# Patient Record
Sex: Male | Born: 1970 | Race: Asian | Hispanic: No | Marital: Single
Health system: Southern US, Community
[De-identification: ages and names within clinical notes are randomized; demographics above are authoritative.]

## PROBLEM LIST (undated history)

## (undated) DIAGNOSIS — I469 Cardiac arrest, cause unspecified: Secondary | ICD-10-CM

## (undated) DIAGNOSIS — E119 Type 2 diabetes mellitus without complications: Secondary | ICD-10-CM

## (undated) DIAGNOSIS — N289 Disorder of kidney and ureter, unspecified: Secondary | ICD-10-CM

## (undated) DIAGNOSIS — I10 Essential (primary) hypertension: Secondary | ICD-10-CM

---

## 2015-11-19 ENCOUNTER — Inpatient Hospital Stay: Payer: Non-veteran care

## 2015-11-19 ENCOUNTER — Inpatient Hospital Stay: Admit: 2015-11-19 | Payer: Non-veteran care

## 2015-11-19 ENCOUNTER — Emergency Department: Payer: Non-veteran care

## 2015-11-19 ENCOUNTER — Inpatient Hospital Stay
Admission: EM | Admit: 2015-11-19 | Discharge: 2015-11-19 | DRG: 296 | Disposition: A | Discharge status: Other Acute Inpatient Hospital | Payer: Non-veteran care | Attending: Internal Medicine | Admitting: Internal Medicine

## 2015-11-19 ENCOUNTER — Encounter: Payer: Self-pay | Admitting: Emergency Medicine

## 2015-11-19 DIAGNOSIS — R001 Bradycardia, unspecified: Secondary | ICD-10-CM | POA: Diagnosis present

## 2015-11-19 DIAGNOSIS — Z992 Dependence on renal dialysis: Secondary | ICD-10-CM | POA: Diagnosis not present

## 2015-11-19 DIAGNOSIS — J810 Acute pulmonary edema: Secondary | ICD-10-CM | POA: Diagnosis present

## 2015-11-19 DIAGNOSIS — E875 Hyperkalemia: Secondary | ICD-10-CM | POA: Diagnosis present

## 2015-11-19 DIAGNOSIS — E872 Acidosis: Secondary | ICD-10-CM | POA: Diagnosis present

## 2015-11-19 DIAGNOSIS — I469 Cardiac arrest, cause unspecified: Secondary | ICD-10-CM | POA: Diagnosis present

## 2015-11-19 DIAGNOSIS — I959 Hypotension, unspecified: Secondary | ICD-10-CM | POA: Diagnosis present

## 2015-11-19 DIAGNOSIS — I120 Hypertensive chronic kidney disease with stage 5 chronic kidney disease or end stage renal disease: Secondary | ICD-10-CM | POA: Diagnosis present

## 2015-11-19 DIAGNOSIS — R680 Hypothermia, not associated with low environmental temperature: Secondary | ICD-10-CM | POA: Diagnosis present

## 2015-11-19 DIAGNOSIS — Z794 Long term (current) use of insulin: Secondary | ICD-10-CM | POA: Diagnosis not present

## 2015-11-19 DIAGNOSIS — Z9114 Patient's other noncompliance with medication regimen: Secondary | ICD-10-CM

## 2015-11-19 DIAGNOSIS — I442 Atrioventricular block, complete: Secondary | ICD-10-CM | POA: Diagnosis present

## 2015-11-19 DIAGNOSIS — I468 Cardiac arrest due to other underlying condition: Principal | ICD-10-CM | POA: Diagnosis present

## 2015-11-19 DIAGNOSIS — Z7901 Long term (current) use of anticoagulants: Secondary | ICD-10-CM | POA: Diagnosis not present

## 2015-11-19 DIAGNOSIS — E1122 Type 2 diabetes mellitus with diabetic chronic kidney disease: Secondary | ICD-10-CM | POA: Diagnosis present

## 2015-11-19 DIAGNOSIS — J9600 Acute respiratory failure, unspecified whether with hypoxia or hypercapnia: Secondary | ICD-10-CM | POA: Diagnosis present

## 2015-11-19 DIAGNOSIS — G253 Myoclonus: Secondary | ICD-10-CM | POA: Insufficient documentation

## 2015-11-19 DIAGNOSIS — I517 Cardiomegaly: Secondary | ICD-10-CM | POA: Diagnosis present

## 2015-11-19 DIAGNOSIS — G9341 Metabolic encephalopathy: Secondary | ICD-10-CM | POA: Diagnosis present

## 2015-11-19 DIAGNOSIS — D65 Disseminated intravascular coagulation [defibrination syndrome]: Secondary | ICD-10-CM | POA: Diagnosis present

## 2015-11-19 DIAGNOSIS — T82868A Thrombosis of vascular prosthetic devices, implants and grafts, initial encounter: Secondary | ICD-10-CM | POA: Diagnosis not present

## 2015-11-19 DIAGNOSIS — R4189 Other symptoms and signs involving cognitive functions and awareness: Secondary | ICD-10-CM

## 2015-11-19 DIAGNOSIS — N186 End stage renal disease: Secondary | ICD-10-CM | POA: Diagnosis present

## 2015-11-19 DIAGNOSIS — R0600 Dyspnea, unspecified: Secondary | ICD-10-CM

## 2015-11-19 DIAGNOSIS — G40901 Epilepsy, unspecified, not intractable, with status epilepticus: Secondary | ICD-10-CM | POA: Diagnosis present

## 2015-11-19 DIAGNOSIS — G931 Anoxic brain damage, not elsewhere classified: Secondary | ICD-10-CM | POA: Diagnosis present

## 2015-11-19 HISTORY — DX: Disorder of kidney and ureter, unspecified: N28.9

## 2015-11-19 HISTORY — DX: Essential (primary) hypertension: I10

## 2015-11-19 HISTORY — DX: Type 2 diabetes mellitus without complications: E11.9

## 2015-11-19 LAB — BLOOD GAS, ARTERIAL
Acid-base deficit: 13.2 mmol/L — ABNORMAL HIGH (ref 0.0–2.0)
Allens test (pass/fail): POSITIVE — AB
BICARBONATE: 13.6 meq/L — AB (ref 21.0–28.0)
FIO2: 1
MECHANICAL RATE: 18
MECHVT: 550 mL
O2 Saturation: 99.7 %
PEEP: 5 cmH2O
PO2 ART: 225 mmHg — AB (ref 83.0–108.0)
Patient temperature: 37
RATE: 18 resp/min
pCO2 arterial: 34 mmHg (ref 32.0–48.0)
pH, Arterial: 7.21 — ABNORMAL LOW (ref 7.350–7.450)

## 2015-11-19 LAB — BASIC METABOLIC PANEL
ANION GAP: 23 — AB (ref 5–15)
ANION GAP: 18 — AB (ref 5–15)
BUN: 113 mg/dL — ABNORMAL HIGH (ref 6–20)
BUN: 69 mg/dL — ABNORMAL HIGH (ref 6–20)
CALCIUM: 7.4 mg/dL — AB (ref 8.9–10.3)
CHLORIDE: 92 mmol/L — AB (ref 101–111)
CHLORIDE: 92 mmol/L — AB (ref 101–111)
CO2: 13 mmol/L — AB (ref 22–32)
CO2: 21 mmol/L — AB (ref 22–32)
Calcium: 9 mg/dL (ref 8.9–10.3)
Creatinine, Ser: 15.52 mg/dL — ABNORMAL HIGH (ref 0.61–1.24)
Creatinine, Ser: 9.76 mg/dL — ABNORMAL HIGH (ref 0.61–1.24)
GFR calc non Af Amer: 3 mL/min — ABNORMAL LOW (ref >60)
GFR calc non Af Amer: 6 mL/min — ABNORMAL LOW (ref >60)
GFR, EST AFRICAN AMERICAN: 4 mL/min — AB (ref >60)
GFR, EST AFRICAN AMERICAN: 7 mL/min — AB (ref >60)
Glucose, Bld: 188 mg/dL — ABNORMAL HIGH (ref 65–99)
Glucose, Bld: 180 mg/dL — ABNORMAL HIGH (ref 65–99)
POTASSIUM: 6.3 mmol/L — AB (ref 3.5–5.1)
SODIUM: 128 mmol/L — AB (ref 135–145)
Sodium: 131 mmol/L — ABNORMAL LOW (ref 135–145)

## 2015-11-19 LAB — PROTIME-INR
INR: 9.74 — AB
INR: 1.69
INR: 1.19
PROTHROMBIN TIME: 74.2 s — AB (ref 11.4–15.0)
Prothrombin Time: 19.9 seconds — ABNORMAL HIGH (ref 11.4–15.0)
Prothrombin Time: 15.3 seconds — ABNORMAL HIGH (ref 11.4–15.0)

## 2015-11-19 LAB — COMPREHENSIVE METABOLIC PANEL
ALBUMIN: 3 g/dL — AB (ref 3.5–5.0)
ALT: 232 U/L — ABNORMAL HIGH (ref 17–63)
ANION GAP: 17 — AB (ref 5–15)
AST: 381 U/L — ABNORMAL HIGH (ref 15–41)
Alkaline Phosphatase: 278 U/L — ABNORMAL HIGH (ref 38–126)
BUN: 55 mg/dL — ABNORMAL HIGH (ref 6–20)
CHLORIDE: 96 mmol/L — AB (ref 101–111)
CO2: 21 mmol/L — AB (ref 22–32)
Calcium: 8 mg/dL — ABNORMAL LOW (ref 8.9–10.3)
Creatinine, Ser: 7.19 mg/dL — ABNORMAL HIGH (ref 0.61–1.24)
GFR calc non Af Amer: 8 mL/min — ABNORMAL LOW (ref >60)
GFR, EST AFRICAN AMERICAN: 9 mL/min — AB (ref >60)
GLUCOSE: 115 mg/dL — AB (ref 65–99)
POTASSIUM: 4.1 mmol/L (ref 3.5–5.1)
SODIUM: 134 mmol/L — AB (ref 135–145)
Total Bilirubin: 1 mg/dL (ref 0.3–1.2)
Total Protein: 6 g/dL — ABNORMAL LOW (ref 6.5–8.1)

## 2015-11-19 LAB — PHOSPHORUS
PHOSPHORUS: 6.6 mg/dL — AB (ref 2.5–4.6)
PHOSPHORUS: 8.4 mg/dL — AB (ref 2.5–4.6)

## 2015-11-19 LAB — TYPE AND SCREEN
ABO/RH(D): AB POS
Antibody Screen: NEGATIVE

## 2015-11-19 LAB — CBC
HCT: 38.7 % — ABNORMAL LOW (ref 40.0–52.0)
HCT: 33.9 % — ABNORMAL LOW (ref 40.0–52.0)
HEMATOCRIT: 22 % — AB (ref 40.0–52.0)
HEMOGLOBIN: 12.1 g/dL — AB (ref 13.0–18.0)
HEMOGLOBIN: 11.3 g/dL — AB (ref 13.0–18.0)
HEMOGLOBIN: 7.2 g/dL — AB (ref 13.0–18.0)
MCH: 26.6 pg (ref 26.0–34.0)
MCH: 26.8 pg (ref 26.0–34.0)
MCH: 26.2 pg (ref 26.0–34.0)
MCHC: 31.2 g/dL — ABNORMAL LOW (ref 32.0–36.0)
MCHC: 33.4 g/dL (ref 32.0–36.0)
MCHC: 32.5 g/dL (ref 32.0–36.0)
MCV: 85.3 fL (ref 80.0–100.0)
MCV: 80.3 fL (ref 80.0–100.0)
MCV: 80.5 fL (ref 80.0–100.0)
PLATELETS: 232 10*3/uL (ref 150–440)
Platelets: 257 10*3/uL (ref 150–440)
Platelets: 173 10*3/uL (ref 150–440)
RBC: 4.53 MIL/uL (ref 4.40–5.90)
RBC: 4.22 MIL/uL — AB (ref 4.40–5.90)
RBC: 2.74 MIL/uL — ABNORMAL LOW (ref 4.40–5.90)
RDW: 16 % — ABNORMAL HIGH (ref 11.5–14.5)
RDW: 15.1 % — ABNORMAL HIGH (ref 11.5–14.5)
RDW: 14.9 % — ABNORMAL HIGH (ref 11.5–14.5)
WBC: 18.4 10*3/uL — AB (ref 3.8–10.6)
WBC: 17 10*3/uL — AB (ref 3.8–10.6)
WBC: 13.5 10*3/uL — AB (ref 3.8–10.6)

## 2015-11-19 LAB — FIBRINOGEN: Fibrinogen: 603 mg/dL — ABNORMAL HIGH (ref 210–470)

## 2015-11-19 LAB — HEMOGLOBIN A1C: Hgb A1c MFr Bld: 9 % — ABNORMAL HIGH (ref 4.0–6.0)

## 2015-11-19 LAB — GLUCOSE, CAPILLARY
GLUCOSE-CAPILLARY: 99 mg/dL (ref 65–99)
Glucose-Capillary: 125 mg/dL — ABNORMAL HIGH (ref 65–99)
Glucose-Capillary: 117 mg/dL — ABNORMAL HIGH (ref 65–99)
Glucose-Capillary: 153 mg/dL — ABNORMAL HIGH (ref 65–99)
Glucose-Capillary: 156 mg/dL — ABNORMAL HIGH (ref 65–99)

## 2015-11-19 LAB — TROPONIN I: Troponin I: 0.17 ng/mL — ABNORMAL HIGH (ref <0.031)

## 2015-11-19 LAB — FIBRIN DEGRADATION PROD.(ARMC ONLY)

## 2015-11-19 LAB — LACTIC ACID, PLASMA: Lactic Acid, Venous: 1.8 mmol/L (ref 0.5–2.0)

## 2015-11-19 LAB — TRIGLYCERIDES: TRIGLYCERIDES: 166 mg/dL — AB (ref <150)

## 2015-11-19 LAB — TSH: TSH: 2.593 u[IU]/mL (ref 0.350–4.500)

## 2015-11-19 LAB — ABO/RH: ABO/RH(D): AB POS

## 2015-11-19 LAB — MRSA PCR SCREENING: MRSA by PCR: NEGATIVE

## 2015-11-19 MED ORDER — PANTOPRAZOLE SODIUM 40 MG IV SOLR
40.0000 mg | Freq: Two times a day (BID) | INTRAVENOUS | Status: DC
  Administered 2015-11-19: 40 mg via INTRAVENOUS
  Filled 2015-11-19: qty 40

## 2015-11-19 MED ORDER — PANTOPRAZOLE SODIUM 40 MG IV SOLR: 40.0000 mg | Freq: Two times a day (BID) | INTRAVENOUS | Status: AC

## 2015-11-19 MED ORDER — DESMOPRESSIN ACETATE 4 MCG/ML IJ SOLN
20.0000 ug | Freq: Once | INTRAMUSCULAR | Status: DC
  Filled 2015-11-19: qty 5

## 2015-11-19 MED ORDER — PROPOFOL 1000 MG/100ML IV EMUL
5.0000 ug/kg/min | Freq: Once | INTRAVENOUS | Status: AC
Start: 2015-11-19 — End: 2015-11-19
  Administered 2015-11-19: 5 ug/kg/min via INTRAVENOUS

## 2015-11-19 MED ORDER — DEXTROSE 5 % IV SOLN
10.0000 mg | INTRAVENOUS | Status: AC
  Administered 2015-11-19: 10 mg via INTRAVENOUS
  Filled 2015-11-19: qty 1

## 2015-11-19 MED ORDER — SODIUM CHLORIDE 0.9% FLUSH
3.0000 mL | Freq: Two times a day (BID) | INTRAVENOUS | Status: DC
  Administered 2015-11-19: 3 mL via INTRAVENOUS

## 2015-11-19 MED ORDER — SODIUM CHLORIDE 0.9 % IV SOLN
500.0000 mg | INTRAVENOUS | Status: DC
  Filled 2015-11-19: qty 5

## 2015-11-19 MED ORDER — ROCURONIUM BROMIDE 50 MG/5ML IV SOLN
0.6000 mg/kg | Freq: Once | INTRAVENOUS | Status: AC
  Administered 2015-11-19: 49.2 mg via INTRAVENOUS
  Filled 2015-11-19: qty 1

## 2015-11-19 MED ORDER — HEPARIN SODIUM (PORCINE) 1000 UNIT/ML DIALYSIS
1000.0000 [IU] | INTRAMUSCULAR | Status: DC | PRN
  Filled 2015-11-19: qty 1

## 2015-11-19 MED ORDER — DOPAMINE-DEXTROSE 3.2-5 MG/ML-% IV SOLN: 0.0000 ug/kg/min | INTRAVENOUS | Status: AC

## 2015-11-19 MED ORDER — INSULIN ASPART 100 UNIT/ML ~~LOC~~ SOLN
8.0000 [IU] | Freq: Once | SUBCUTANEOUS | Status: AC
  Administered 2015-11-19: 8 [IU] via INTRAVENOUS

## 2015-11-19 MED ORDER — ACETAMINOPHEN 325 MG PO TABS: 650.0000 mg | ORAL_TABLET | Freq: Four times a day (QID) | ORAL | Status: DC | PRN

## 2015-11-19 MED ORDER — MIDAZOLAM HCL 2 MG/2ML IJ SOLN
4.0000 mg | Freq: Once | INTRAMUSCULAR | Status: AC
  Administered 2015-11-19: 4 mg via INTRAVENOUS

## 2015-11-19 MED ORDER — ALTEPLASE 2 MG IJ SOLR: 2.0000 mg | Freq: Once | INTRAMUSCULAR | Status: DC | PRN

## 2015-11-19 MED ORDER — PROPOFOL 1000 MG/100ML IV EMUL
5.0000 ug/kg/min | INTRAVENOUS | Status: DC
Start: 2015-11-19 — End: 2015-11-20
  Administered 2015-11-19: 5 ug/kg/min via INTRAVENOUS
  Filled 2015-11-19: qty 100

## 2015-11-19 MED ORDER — SODIUM CHLORIDE 0.9 % IV SOLN: 500.0000 mg | INTRAVENOUS | Status: AC

## 2015-11-19 MED ORDER — PROPOFOL 1000 MG/100ML IV EMUL
INTRAVENOUS | Status: AC
  Administered 2015-11-19: 5 ug/kg/min via INTRAVENOUS
  Filled 2015-11-19: qty 100

## 2015-11-19 MED ORDER — CHLORHEXIDINE GLUCONATE 0.12% ORAL RINSE (MEDLINE KIT)
15.0000 mL | Freq: Two times a day (BID) | OROMUCOSAL | Status: DC
  Administered 2015-11-19: 15 mL via OROMUCOSAL
  Filled 2015-11-19 (×3): qty 15

## 2015-11-19 MED ORDER — VALPROATE SODIUM 500 MG/5ML IV SOLN
500.0000 mg | Freq: Once | INTRAVENOUS | Status: AC
  Administered 2015-11-19: 500 mg via INTRAVENOUS
  Filled 2015-11-19: qty 5

## 2015-11-19 MED ORDER — ACETAMINOPHEN 10 MG/ML IV SOLN
1000.0000 mg | Freq: Once | INTRAVENOUS | Status: AC
  Administered 2015-11-19: 1000 mg via INTRAVENOUS
  Filled 2015-11-19: qty 100

## 2015-11-19 MED ORDER — CHLORHEXIDINE GLUCONATE 0.12% ORAL RINSE (MEDLINE KIT)
15.0000 mL | Freq: Two times a day (BID) | OROMUCOSAL | Status: DC
  Filled 2015-11-19 (×3): qty 15

## 2015-11-19 MED ORDER — ONDANSETRON HCL 4 MG/2ML IJ SOLN: 4.0000 mg | Freq: Four times a day (QID) | INTRAMUSCULAR | Status: DC | PRN

## 2015-11-19 MED ORDER — SODIUM CHLORIDE 0.9 % IV SOLN
20.0000 ug | Freq: Once | INTRAVENOUS | Status: AC
  Administered 2015-11-19: 20 ug via INTRAVENOUS
  Filled 2015-11-19: qty 5

## 2015-11-19 MED ORDER — MIDAZOLAM HCL 2 MG/2ML IJ SOLN
INTRAMUSCULAR | Status: AC
  Administered 2015-11-19: 4 mg via INTRAVENOUS
  Filled 2015-11-19: qty 4

## 2015-11-19 MED ORDER — SODIUM CHLORIDE 0.9 % IV SOLN: Freq: Once | INTRAVENOUS | Status: DC

## 2015-11-19 MED ORDER — ALBUTEROL SULFATE (2.5 MG/3ML) 0.083% IN NEBU
INHALATION_SOLUTION | RESPIRATORY_TRACT | Status: AC
  Administered 2015-11-19: 04:00:00
  Filled 2015-11-19: qty 3

## 2015-11-19 MED ORDER — PANTOPRAZOLE SODIUM 40 MG IV SOLR
40.0000 mg | INTRAVENOUS | Status: DC
  Administered 2015-11-19: 40 mg via INTRAVENOUS
  Filled 2015-11-19: qty 40

## 2015-11-19 MED ORDER — PROPOFOL 1000 MG/100ML IV EMUL: 5.0000 ug/kg/min | INTRAVENOUS | Status: AC

## 2015-11-19 MED ORDER — LORAZEPAM 2 MG/ML IJ SOLN: 2.0000 mg | INTRAMUSCULAR | Status: AC | PRN

## 2015-11-19 MED ORDER — VALPROATE SODIUM 500 MG/5ML IV SOLN
1500.0000 mg | Freq: Once | INTRAVENOUS | Status: AC
  Administered 2015-11-19: 1500 mg via INTRAVENOUS
  Filled 2015-11-19: qty 15

## 2015-11-19 MED ORDER — PENTAFLUOROPROP-TETRAFLUOROETH EX AERO: 1.0000 "application " | INHALATION_SPRAY | CUTANEOUS | Status: DC | PRN

## 2015-11-19 MED ORDER — ANTISEPTIC ORAL RINSE SOLUTION (CORINZ)
7.0000 mL | Freq: Four times a day (QID) | OROMUCOSAL | Status: DC
  Filled 2015-11-19 (×4): qty 7

## 2015-11-19 MED ORDER — PIPERACILLIN-TAZOBACTAM 3.375 G IVPB: 3.3750 g | Freq: Two times a day (BID) | INTRAVENOUS | Status: AC

## 2015-11-19 MED ORDER — FENTANYL CITRATE (PF) 100 MCG/2ML IJ SOLN: 100.0000 ug | INTRAMUSCULAR | Status: DC | PRN

## 2015-11-19 MED ORDER — PROTHROMBIN COMPLEX CONC HUMAN 500 UNITS IV KIT
2000.0000 [IU] | PACK | Status: AC
  Administered 2015-11-19: 2000 [IU] via INTRAVENOUS
  Filled 2015-11-19: qty 80

## 2015-11-19 MED ORDER — ETOMIDATE 2 MG/ML IV SOLN
INTRAVENOUS | Status: AC | PRN
  Administered 2015-11-19: 20 mg via INTRAVENOUS

## 2015-11-19 MED ORDER — LIDOCAINE-PRILOCAINE 2.5-2.5 % EX CREA: 1.0000 "application " | TOPICAL_CREAM | CUTANEOUS | Status: DC | PRN

## 2015-11-19 MED ORDER — SODIUM BICARBONATE 8.4 % IV SOLN
INTRAVENOUS | Status: AC | PRN
  Administered 2015-11-19: 50 meq via INTRAVENOUS
  Administered 2015-11-19: 25 meq via INTRAVENOUS

## 2015-11-19 MED ORDER — DOPAMINE-DEXTROSE 3.2-5 MG/ML-% IV SOLN
INTRAVENOUS | Status: AC | PRN
  Administered 2015-11-19: 15 ug/kg/min via INTRAVENOUS

## 2015-11-19 MED ORDER — PIPERACILLIN-TAZOBACTAM 3.375 G IVPB
3.3750 g | Freq: Two times a day (BID) | INTRAVENOUS | Status: DC
  Administered 2015-11-19 (×2): 3.375 g via INTRAVENOUS
  Filled 2015-11-19 (×3): qty 50

## 2015-11-19 MED ORDER — ALBUTEROL SULFATE (2.5 MG/3ML) 0.083% IN NEBU
INHALATION_SOLUTION | RESPIRATORY_TRACT | Status: AC
  Administered 2015-11-19: 04:00:00
  Filled 2015-11-19: qty 12

## 2015-11-19 MED ORDER — LIDOCAINE HCL (PF) 1 % IJ SOLN: 5.0000 mL | INTRAMUSCULAR | Status: DC | PRN

## 2015-11-19 MED ORDER — FENTANYL CITRATE (PF) 100 MCG/2ML IJ SOLN: 100.0000 ug | INTRAMUSCULAR | Status: AC | PRN

## 2015-11-19 MED ORDER — SODIUM CHLORIDE 0.9 % IV SOLN: 100.0000 mL | INTRAVENOUS | Status: DC | PRN

## 2015-11-19 MED ORDER — SODIUM CHLORIDE 0.9 % IV SOLN
500.0000 mg | Freq: Two times a day (BID) | INTRAVENOUS | Status: DC
  Filled 2015-11-19: qty 5

## 2015-11-19 MED ORDER — PROTHROMBIN COMPLEX CONC HUMAN 500 UNITS IV KIT
50.0000 [IU]/kg | PACK | Status: DC
  Filled 2015-11-19: qty 164

## 2015-11-19 MED ORDER — VITAMIN K1 10 MG/ML IJ SOLN: 10.0000 mg | INTRAVENOUS | Status: DC

## 2015-11-19 MED ORDER — DOCUSATE SODIUM 100 MG PO CAPS: 100.0000 mg | ORAL_CAPSULE | Freq: Two times a day (BID) | ORAL | Status: DC

## 2015-11-19 MED ORDER — SODIUM CHLORIDE 0.9 % IV SOLN
1000.0000 mg | Freq: Once | INTRAVENOUS | Status: AC
  Administered 2015-11-19: 1000 mg via INTRAVENOUS
  Filled 2015-11-19: qty 10

## 2015-11-19 MED ORDER — ONDANSETRON HCL 4 MG PO TABS: 4.0000 mg | ORAL_TABLET | Freq: Four times a day (QID) | ORAL | Status: DC | PRN

## 2015-11-19 MED ORDER — INSULIN ASPART 100 UNIT/ML ~~LOC~~ SOLN: 0.0000 [IU] | SUBCUTANEOUS | Status: AC

## 2015-11-19 MED ORDER — ANTISEPTIC ORAL RINSE SOLUTION (CORINZ)
7.0000 mL | Freq: Four times a day (QID) | OROMUCOSAL | Status: DC
  Administered 2015-11-19 (×2): 7 mL via OROMUCOSAL
  Filled 2015-11-19 (×4): qty 7

## 2015-11-19 MED ORDER — INSULIN ASPART 100 UNIT/ML ~~LOC~~ SOLN
0.0000 [IU] | SUBCUTANEOUS | Status: DC
  Administered 2015-11-19 (×2): 3 [IU] via SUBCUTANEOUS
  Filled 2015-11-19 (×2): qty 3

## 2015-11-19 MED ORDER — HEPARIN SODIUM (PORCINE) 5000 UNIT/ML IJ SOLN: 5000.0000 [IU] | Freq: Three times a day (TID) | INTRAMUSCULAR | Status: DC

## 2015-11-19 MED ORDER — ATROPINE SULFATE 1 MG/ML IJ SOLN
INTRAMUSCULAR | Status: AC | PRN
  Administered 2015-11-19 (×2): 1 mg via INTRAVENOUS

## 2015-11-19 MED ORDER — DOPAMINE-DEXTROSE 3.2-5 MG/ML-% IV SOLN: 0.0000 ug/kg/min | INTRAVENOUS | Status: DC

## 2015-11-19 MED ORDER — VITAMIN K1 10 MG/ML IJ SOLN
5.0000 mg | Freq: Once | INTRAVENOUS | Status: AC
  Administered 2015-11-19: 5 mg via INTRAVENOUS
  Filled 2015-11-19: qty 0.5

## 2015-11-19 MED ORDER — LORAZEPAM 2 MG/ML IJ SOLN
2.0000 mg | INTRAMUSCULAR | Status: DC | PRN
  Administered 2015-11-19 (×3): 4 mg via INTRAVENOUS
  Filled 2015-11-19 (×5): qty 2

## 2015-11-19 MED ORDER — DEXTROSE 50 % IV SOLN
INTRAVENOUS | Status: AC | PRN
  Administered 2015-11-19: 25 mL via INTRAVENOUS

## 2015-11-19 MED ORDER — ROCURONIUM BROMIDE 50 MG/5ML IV SOLN
INTRAVENOUS | Status: AC | PRN
  Administered 2015-11-19: 10 mg via INTRAVENOUS

## 2015-11-19 MED ORDER — ACETAMINOPHEN 650 MG RE SUPP
650.0000 mg | Freq: Four times a day (QID) | RECTAL | Status: DC | PRN
  Filled 2015-11-19: qty 1

## 2015-11-19 NOTE — Progress Notes (Signed)
Pre HD  

## 2015-11-19 NOTE — Progress Notes (Signed)
Hemodialysis start 

## 2015-11-19 NOTE — Progress Notes (Signed)
Paged Dr Cherylann RatelLateef regarding patients potassium level. Paged on call Dr regarding patients CT scan results.

## 2015-11-19 NOTE — H&P (Signed)
Spencer Lopez is an 45 y.o. male.   Chief Complaint: Cardiac arrest HPI: The patient presents to the emergency department via EMS after dispensing a cardiac arrest. Past medical history significant for end-stage renal disease on hemodialysis. The patient reportedly missed a dialysis treatment. In route he received CPR. Return circulation was achieved and the emergency department staff attempted to shift his potassium stabilized his heart rhythm. Following intubation and sedation the emergency department staff called the hospitalist service for admission.  Past Medical History  Diagnosis Date  . Diabetes mellitus without complication ([place])   . Hypertension   . Renal disorder     History reviewed. No pertinent past surgical history.  History reviewed. No pertinent family history. Social History:  has no tobacco, alcohol, and drug history on file.  Allergies: No Known Allergies  No prescriptions prior to admission    Results for orders placed or performed during the hospital encounter of 11/19/15 (from the past 48 hour(s))  Basic metabolic panel     Status: Abnormal   Collection Time: 11/19/15  2:50 AM  Result Value Ref Range   Sodium 128 (L) 135 - 145 mmol/L    Comment: RESULTS VERIFIED BY REPEAT TESTING   Potassium >7.5 (HH) 3.5 - 5.1 mmol/L    Comment: CRITICAL RESULT CALLED TO, READ BACK BY AND VERIFIED WITH [facility] YUAL AT 8938 11/19/15.PMH   Chloride 92 (L) 101 - 111 mmol/L   CO2 13 (L) 22 - 32 mmol/L   Glucose, Bld 188 (H) 65 - 99 mg/dL   BUN 113 (H) 6 - 20 mg/dL    Comment: RESULT CONFIRMED BY MANUAL DILUTION   Creatinine, Ser 15.52 (H) 0.61 - 1.24 mg/dL   Calcium 9.0 8.9 - 10.3 mg/dL   GFR calc non Af Amer 3 (L) >60 mL/min   GFR calc Af Amer 4 (L) >60 mL/min    Comment: (NOTE) The eGFR has been calculated using the CKD EPI equation. This calculation has not been validated in all clinical situations. eGFR's persistently <60 mL/min signify possible Chronic  Kidney Disease.    Anion gap 23 (H) 5 - 15  CBC     Status: Abnormal   Collection Time: 11/19/15  2:50 AM  Result Value Ref Range   WBC 18.4 (H) 3.8 - 10.6 K/uL   RBC 4.53 4.40 - 5.90 MIL/uL   Hemoglobin 12.1 (L) 13.0 - 18.0 g/dL   HCT 38.7 (L) 40.0 - 52.0 %   MCV 85.3 80.0 - 100.0 fL   MCH 26.6 26.0 - 34.0 pg   MCHC 31.2 (L) 32.0 - 36.0 g/dL   RDW 16.0 (H) 11.5 - 14.5 %   Platelets 257 150 - 440 K/uL  Troponin I     Status: Abnormal   Collection Time: 11/19/15  2:50 AM  Result Value Ref Range   Troponin I 0.17 (H) <0.031 ng/mL    Comment: READ BACK AND VERIFIED WITH [facility] YUAL AT 1017 11/19/15.PMH        PERSISTENTLY INCREASED TROPONIN VALUES IN THE RANGE OF 0.04-0.49 ng/mL CAN BE SEEN IN:       -UNSTABLE ANGINA       -CONGESTIVE HEART FAILURE       -MYOCARDITIS       -CHEST TRAUMA       -ARRYHTHMIAS       -LATE PRESENTING MYOCARDIAL INFARCTION       -COPD   CLINICAL FOLLOW-UP RECOMMENDED.   Blood gas, arterial     Status: Abnormal  Collection Time: 11/19/15  3:01 AM  Result Value Ref Range   FIO2 1.00    Delivery systems VENTILATOR    Mode ASSIST CONTROL    VT 550 mL   LHR 18 resp/min   Peep/cpap 5.0 cm H20   pH, Arterial 7.21 (L) 7.350 - 7.450   pCO2 arterial 34 32.0 - 48.0 mmHg   pO2, Arterial 225 (H) 83.0 - 108.0 mmHg   Bicarbonate 13.6 (L) 21.0 - 28.0 mEq/L   Acid-base deficit 13.2 (H) 0.0 - 2.0 mmol/L   O2 Saturation 99.7 %   Patient temperature 37.0    Collection site RIGHT RADIAL    Sample type ARTERIAL DRAW    Allens test (pass/fail) POSITIVE (A) PASS   Mechanical Rate 18   Glucose, capillary     Status: None   Collection Time: 11/19/15  3:07 AM  Result Value Ref Range   Glucose-Capillary 99 65 - 99 mg/dL  MRSA PCR Screening     Status: None   Collection Time: 11/19/15  5:27 AM  Result Value Ref Range   MRSA by PCR NEGATIVE NEGATIVE    Comment:        The GeneXpert MRSA Assay (FDA approved for NASAL specimens only), is one component of  a comprehensive MRSA colonization surveillance program. It is not intended to diagnose MRSA infection nor to guide or monitor treatment for MRSA infections.    Dg Abd 1 View  11/19/2015  CLINICAL DATA:  NG tube placement.  Patient was found unresponsive. EXAM: ABDOMEN - 1 VIEW COMPARISON:  None. FINDINGS: Gaseous distention of the stomach. Gaseous distention of the upper abdominal small bowel. Changes likely to represent obstruction. Fold thickening of the small bowel could also indicate enteritis. Enteric tube is not visualized within the field of view of this study. IMPRESSION: Abnormal bowel gas pattern with distended stomach and distended small bowel with small bowel fold thickening. Changes could represent obstruction and/or enteritis. Enteric tube is not visualized within the field of view. Electronically Signed   By: Lucienne Capers M.D.   On: 11/19/2015 04:37   Dg Chest Port 1 View  11/19/2015  CLINICAL DATA:  Cardiac arrest.  Intubation. EXAM: PORTABLE CHEST 1 VIEW COMPARISON:  None. FINDINGS: Endotracheal tube with tip measuring 3.3 cm above the carina. Shallow inspiration. Cardiac enlargement without vascular congestion. There is a focal nodule in the right upper lung measuring 1.9 cm diameter. There is additional nodular consolidation or confluent nodules demonstrated in the right perihilar and infrahilar region. Changes may represent pneumonia although neoplasm should be excluded. Consider CT chest for further evaluation. No blunting of costophrenic angles. No pneumothorax. Visualized bones appear grossly intact. IMPRESSION: Cardiac enlargement. Nodular opacities in the right lung. CT suggested for further evaluation. Electronically Signed   By: Lucienne Capers M.D.   On: 11/19/2015 03:36    Review of Systems  Unable to perform ROS: intubated    Blood pressure 131/68, pulse 81, temperature 96.6 F (35.9 C), resp. rate 18, height 5' 8"  (1.727 m), weight 82 kg (180 lb 12.4 oz), SpO2  100 %. Physical Exam  Constitutional: He is oriented to person, place, and time. He appears well-developed and well-nourished. He appears distressed. He is intubated.  HENT:  Head: Normocephalic and atraumatic.  Mouth/Throat: Oropharynx is clear and moist.  Eyes: Conjunctivae and EOM are normal. Pupils are equal, round, and reactive to light. No scleral icterus.  Neck: Normal range of motion. Neck supple. No JVD present. No tracheal deviation present.  No thyromegaly present.  Respiratory: He is intubated. He has rales.  GI: Soft. Bowel sounds are normal. He exhibits no distension. There is no tenderness.  Genitourinary:  Foley in place  Musculoskeletal: Normal range of motion. He exhibits no edema.  Lymphadenopathy:    He has no cervical adenopathy.  Neurological: He is alert and oriented to person, place, and time. No cranial nerve deficit.  Skin: Skin is warm and dry. No erythema.  Psychiatric: He has a normal mood and affect. His behavior is normal. Judgment and thought content normal.     Assessment/Plan This is a 45 year old male admitted for cardiorespiratory arrest. 1. Cardiac arrest: Return perfusion achieved with the patient is now on dopamine due to hypotension. 2. Hyperkalemia: Potassium shifted to the emergency department. Continue to monitor telemetry and repeat electrolytes following dialysis 3. ESRD on dialysis: Patient will need urgent dialysis. Nephrology has been consulted and will initiate treatment as soon as possible. 4. Diabetes mellitus type 2: Will place patient on sliding scale insulin while critically ill (will obtain home medications once pharmacies are open)  5. DVT prophylaxis: Heparin 6. GI prophylaxis: None The patient is a full code. Time spent on admission was inpatient care approximately 45 minutes  Harrie Foreman, MD 11/19/2015, 7:08 AM

## 2015-11-19 NOTE — Progress Notes (Signed)
Hemodialysis ended

## 2015-11-19 NOTE — Progress Notes (Signed)
EEG completed, results pending. 

## 2015-11-19 NOTE — Procedures (Signed)
Central Venous Catheter Placement: Indication: Patient receiving vesicant or irritant drug.; Patient receiving intravenous therapy for longer than 5 days.; Patient has limited or no vascular access.   Consent:Emergent.     Hand washing performed prior to starting the procedure.   Procedure: An active timeout was performed and correct patient, name, & ID confirmed.  After explaining risk and benefits, patient was positioned correctly for central venous access. Patient was prepped using strict sterile technique including chlorohexadine preps, sterile drape, sterile gown and sterile gloves.  The area was prepped, draped and anesthetized in the usual sterile manner. Patient comfort was obtained.  A triple lumen catheter was placed in right femoral Vein There was good blood return, catheter caps were placed on lumens, catheter flushed easily, the line was secured and a sterile dressing and BIO-PATCH applied.   Ultrasound was used to visualize vasculature and guidance of needle.   Number of Attempts: 1 Complications:none Estimated Blood Loss: 15 ml Operator: Wells Guileseep Rohaan Durnil, M.D.   Wells Guileseep Diavion Labrador, M.D.  Pulmonary & Critical Care Medicine [facility] Medical Director Intensive Care Unit

## 2015-11-19 NOTE — Progress Notes (Signed)
Initial Nutrition Assessment   INTERVENTION:   Coordination of Care: recommend initiation of EN as medically able within 24-48 hours of intubation. Will continue to follow and make recommendations accordingly.   NUTRITION DIAGNOSIS:   Inadequate oral intake related to inability to eat as evidenced by NPO status.  GOAL:   Provide needs based on ASPEN/SCCM guidelines  MONITOR:   Vent status, Weight trends, Labs, I & O's  REASON FOR ASSESSMENT:   Ventilator, Malnutrition Screening Tool    ASSESSMENT:    Per MD note: This is a 45 year old male with end-stage renal disease on hemodialysis, the patient reportedly missed session of dialysis, he was found down, EMS found the patient in asystole. He underwent CPR 10 minutes. Currently intubated on the ventilator.  Pt s/p emergent dialysis this am. Pt not a candidate for hypothermic protocol per MD note. Pt with seizure like activity this am. Neurology following. Pt also noted to have bleeding from IV site, oral cavity, nasal cavity, and foley insertion site. OG tube could not be seen on X-ray this am per Nsg note.  Past Medical History  Diagnosis Date  . Diabetes mellitus without complication (HCC)   . Hypertension   . Renal disorder     Diet Order:  Diet NPO time specified Except for: Sips with Meds    Current Nutrition: Pt NPO  Food/Nutrition-Related History: Per MST decrease in appetite PTA. Unable to clarify on rounds this am.   Scheduled Medications:  . sodium chloride   Intravenous Once  . antiseptic oral rinse  7 mL Mouth Rinse QID  . antiseptic oral rinse  7 mL Mouth Rinse QID  . chlorhexidine gluconate  15 mL Mouth Rinse BID  . chlorhexidine gluconate  15 mL Mouth Rinse BID  . desmopressin (DDAVP) IV  20 mcg Intravenous Once  . docusate sodium  100 mg Oral BID  . insulin aspart  0-15 Units Subcutaneous 6 times per day  . [START ON 10/31/2015] levETIRAcetam  500 mg Intravenous Q24H  . pantoprazole (PROTONIX) IV   40 mg Intravenous Q24H  . phytonadione (VITAMIN K) IV  5 mg Intravenous Once  . sodium chloride flush  3 mL Intravenous Q12H    Continuous Medications:  . DOPamine 6 mcg/kg/min (11/19/15 0745)  . propofol (DIPRIVAN) infusion 15 mcg/kg/min (11/19/15 0725)     Electrolyte/Renal Profile and Glucose Profile:   Recent Labs Lab 11/19/15 0250 11/19/15 0903  NA 128* 134*  K >7.5* 4.1  CL 92* 96*  CO2 13* 21*  BUN 113* 55*  CREATININE 15.52* 7.19*  CALCIUM 9.0 8.0*  PHOS  --  6.6*  GLUCOSE 188* 115*   Protein Profile:  Recent Labs Lab 11/19/15 0903  ALBUMIN 3.0*    Gastrointestinal Profile: OG tube to suction, with dark red output Last BM: unknown, hypoactive BS  Net UF after HD this am: -115mL   Nutrition-Focused Physical Exam Findings:  Unable to complete Nutrition-Focused physical exam at this time.    Weight Change: Per MST decrease in weight PTA, however no weight trend found in CHL encounters to verify   Height:   Ht Readings from Last 1 Encounters:  11/19/15 5\' 8"  (1.727 m)    Weight:   Wt Readings from Last 1 Encounters:  11/19/15 180 lb 12.4 oz (82 kg)     BMI:  Body mass index is 27.49 kg/(m^2).  Estimated Nutritional Needs:   Kcal:  will assess once on the vent >24 hours  Protein:  164-205g protein (2.0-2.5g/kg  protein)  Fluid:  UOP+1L  EDUCATION NEEDS:   No education needs identified at this time   HIGH Care Level  Leda Quail, RD, LDN Pager [phone] Weekend/On-Call Pager [phone]

## 2015-11-19 NOTE — Progress Notes (Signed)
VS: BP 98/59 mmHg  Pulse 82  Temp(Src) 99.3 F (37.4 C)  Resp 16  Ht 5\' 8"  (1.727 m)  Wt 180 lb 12.4 oz (82 kg)  BMI 27.49 kg/m2  SpO2 100%  General Appearance: No distress  Neuro:without focal findings, mental status, Unresponsive , occasional gasp and startle response when stimulated physically.  D/w dr. Elisabeth Lopez, doubt re: request for transfer and EEG results. Repeat coags pending. May need more FFP/Vitamin K.   Will await results of CT head. If no bleed then transfer to [facility]/tertiary for [facility] EEG [facility] If bleeding in the brain then prognosis poor.   Spencer Lopez.  11/19/2015   Critical Care Attestation.  I have personally obtained a history, examined the patient, evaluated laboratory and imaging results, formulated the assessment and plan and placed orders. The Patient requires high complexity decision making for assessment and support, frequent evaluation and titration of therapies, application of advanced monitoring technologies and extensive interpretation of multiple databases. The patient has critical illness that could lead imminently to failure of 1 or more organ systems and requires the highest level of physician preparedness to intervene.  Critical Care Time devoted to patient care services described in this note is 30 minutes and is exclusive of time spent in procedures.

## 2015-11-19 NOTE — Progress Notes (Signed)
ANTICOAGULATION CONSULT NOTE - Initial Consult  [facility] for [facility] dosing  No Known Allergies  Patient Measurements: Height: 5\' 8"  (172.7 cm) Weight: 180 lb 12.4 oz (82 kg) IBW/kg (Calculated) : 68.4  Vital Signs: Temp: 98.8 F (37.1 C) (03/23 2030) Temp Source: Axillary (03/23 2030) BP: 124/80 mmHg (03/23 2215) Pulse Rate: 85 (03/23 2223)  Labs:  Recent Labs  11/19/15 0250 11/19/15 0903 11/19/15 1741 11/19/15 2056  HGB 12.1* 11.3* 7.2*  --   HCT 38.7* 33.9* 22.0*  --   PLT 257 232 173  --   LABPROT 74.2*  --  19.9* 15.3*  INR 9.74*  --  1.69 1.19  CREATININE 15.52* 7.19* 9.76*  --   TROPONINI 0.17*  --   --   --     Estimated Creatinine Clearance: 9.1 mL/min (by C-G formula based on Cr of 9.76).   Medical History: Past Medical History  Diagnosis Date  . Diabetes mellitus without complication (H[facility])   . Hypertension   . Renal disorder    Assessment: Pharmacy consulted to dose and monitor [facility] in this 45 year old male. INR earlier today was 9.74 - patient received vitamin K 5 mg. Most recent INR at time [facility] was ordered was 1.69.   Plan:  Per dosing protocol - based on INR of 1.69 and weight of 82 kg - will order 2000 units of KCentra (25 units/kg dose).   INR odered for 60 minutes post-infusion at 20:50 then q6h x 4 labs followed by daily x 2 days.  3/23:  INR @ 21:00 = 1.19  Kincade Granberg D, PharmD 11/19/2015,10:23 PM

## 2015-11-19 NOTE — Progress Notes (Signed)
ANTICOAGULATION CONSULT NOTE - Initial Consult  [facility] dosing  No Known Allergies  Patient Measurements: Height: 5\' 8"  (172.7 cm) Weight: 180 lb 12.4 oz (82 kg) IBW/kg (Calculated) : 68.4  Vital Signs: Temp: 100.3 F (37.9 C) (03/23 2000) Temp Source: Axillary (03/23 1800) BP: 119/60 mmHg (03/23 2000) Pulse Rate: 84 (03/23 2000)  Labs:  Recent Labs  11/19/15 0250 11/19/15 0903 11/19/15 1741  HGB 12.1* 11.3* 7.2*  HCT 38.7* 33.9* 22.0*  PLT 257 232 173  LABPROT 74.2*  --  19.9*  INR 9.74*  --  1.69  CREATININE 15.52* 7.19* 9.76*  TROPONINI 0.17*  --   --     Estimated Creatinine Clearance: 9.1 mL/min (by C-G formula based on Cr of 9.76).   Medical History: Past Medical History  Diagnosis Date  . Diabetes mellitus without complication (HCC)   . Hypertension   . Renal disorder    Assessment: Pharmacy consulted to dose and monitor [facility] in this 45 year old male. INR earlier today was 9.74 - patient received vitamin K 5 mg. Most recent INR at time [facility] was ordered was 1.69.   Plan:  Per dosing protocol - based on INR of 1.69 and weight of 82 kg - will order 2000 units of KCentra (25 units/kg dose).   INR odered for 60 minutes post-infusion at 20:50 then q6h x 4 labs followed by daily x 2 days.  Cindi CarbonMary M Deante Blough, PharmD 11/19/2015,8:28 PM

## 2015-11-19 NOTE — Progress Notes (Signed)
Post hd tx 

## 2015-11-19 NOTE — Progress Notes (Signed)
K rising again, discussed with critical care RN as well as HD nurse, will plan for HD again now using 1K bath.

## 2015-11-19 NOTE — Code Documentation (Signed)
CBG- 99 

## 2015-11-19 NOTE — Progress Notes (Signed)
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  If it shows Bleed , then prognosis extremely poor- and he may not need transfer.  This was as per Neurologist. We still need to decide , if he is stable to transfer.  I will ask criticl care specialist about that.

## 2015-11-19 NOTE — Consult Note (Signed)
Reason for Consult:Unresponsive Referring Physician: Elisabeth Pigeon  CC: Unresponsive after cardiac arrest  HPI: Spencer Lopez is an 45 y.o. male who is unresponsive and unable to provide any history.  No family is present.  All history obtained from the chart.  Patient was found by his brother after missing a dialysis session who started CPR and called 911.  Patient with unknown time down.  Upon EMS arrival this was taken over by them and ROSC was obtained after approximately 10 minutes. In the ED EKG showed CHB with 29 bom, BP 107/56. King airway was in place. Patient's glucose was in the 20's as well. He was noted to have a potassium of >7.5. He received calcium chloride, insulin, and D50.   Past Medical History  Diagnosis Date  . Diabetes mellitus without complication (HCC)   . Hypertension   . Renal disorder     History reviewed. No pertinent past surgical history.  Unable to obtain secondary to mental status  Family History  Problem Relation Age of Onset  . Family history unknown: Yes  Unable to obtain due to mental status  Social History:  has no tobacco, alcohol, and drug history on file.  Unable to obtain secondary to mental status  No Known Allergies  Medications:  I have reviewed the patient's current medications. Prior to Admission:  No prescriptions prior to admission   Scheduled: . antiseptic oral rinse  7 mL Mouth Rinse QID  . antiseptic oral rinse  7 mL Mouth Rinse QID  . chlorhexidine gluconate  15 mL Mouth Rinse BID  . chlorhexidine gluconate  15 mL Mouth Rinse BID  . docusate sodium  100 mg Oral BID  . heparin  5,000 Units Subcutaneous 3 times per day  . insulin aspart  0-15 Units Subcutaneous 6 times per day  . levETIRAcetam  500 mg Intravenous Q12H  . pantoprazole (PROTONIX) IV  40 mg Intravenous Q24H  . sodium chloride flush  3 mL Intravenous Q12H    ROS: Unable to obtain secondary to mental status  Physical Examination: Blood pressure 137/67, pulse  89, temperature 99.3 F (37.4 C), resp. rate 14, height  (1.727 m), weight 82 kg (180 lb 12.4 oz), SpO2 100 %.  HEENT-  Normocephalic, no lesions, without obvious abnormality.  Normal external eye and conjunctiva.  Normal TM's bilaterally.  Normal auditory canals and external ears. Normal external nose, mucus membranes and septum.  Normal pharynx. Cardiovascular- S1, S2 normal, pulses palpable throughout   Lungs- chest clear, no wheezing, rales, normal symmetric air entry Abdomen- soft, non-tender; bowel sounds normal; no masses,  no organomegaly Extremities- no edema Lymph-no adenopathy palpable Musculoskeletal-no joint tenderness, deformity or swelling Skin-warm and dry, no hyperpigmentation, vitiligo, or suspicious lesions  Neurological Examination Mental Status: Patient does not respond to verbal stimuli.  With deep sternal rub or any stimulation has generalized myoclonus .  Does not follow commands.  No verbalizations noted.  Cranial Nerves: II: patient does not respond confrontation bilaterally, pupils right 3 mm, left 3 mm,and unreactive bilaterally III,IV,VI: doll's response present bilaterally.  V,VII: corneal reflex absent bilaterally  VIII: patient does not respond to verbal stimuli IX,X: gag reflex absent, XI: trapezius strength unable to test bilaterally XII: tongue strength unable to test Motor: Extremities flaccid throughout.  No purposeful movements noted. Sensory: Does not respond to noxious stimuli in any extremity. Deep Tendon Reflexes:  2+ throughout with absent AJ's bilaterally Plantars: mute bilaterally Cerebellar: Unable to perform  Laboratory Studies:   Basic Metabolic Panel:  Recent Labs Lab 11/19/15 0250 11/19/15 0903  NA 128*  --   K >7.5*  --   CL 92*  --   CO2 13*  --   GLUCOSE 188*  --   BUN 113*  --   CREATININE 15.52*  --   CALCIUM 9.0  --   PHOS  --  6.6*    Liver Function Tests: No results for input(s): AST, ALT,  ALKPHOS, BILITOT, PROT, ALBUMIN in the last 168 hours. No results for input(s): LIPASE, AMYLASE in the last 168 hours. No results for input(s): AMMONIA in the last 168 hours.  CBC:  Recent Labs Lab 11/19/15 0250  WBC 18.4*  HGB 12.1*  HCT 38.7*  MCV 85.3  PLT 257    Cardiac Enzymes:  Recent Labs Lab 11/19/15 0250  TROPONINI 0.17*    BNP: Invalid input(s): POCBNP  CBG:  Recent Labs Lab 11/19/15 0307 11/19/15 1038  GLUCAP 99 125*    Microbiology: Results for orders placed or performed during the hospital encounter of 11/19/15  MRSA PCR Screening     Status: None   Collection Time: 11/19/15  5:27 AM  Result Value Ref Range Status   MRSA by PCR NEGATIVE NEGATIVE Final    Comment:        The GeneXpert MRSA Assay (FDA approved for NASAL specimens only), is one component of a comprehensive MRSA colonization surveillance program. It is not intended to diagnose MRSA infection nor to guide or monitor treatment for MRSA infections.     Coagulation Studies: No results for input(s): LABPROT, INR in the last 72 hours.  Urinalysis: No results for input(s): COLORURINE, LABSPEC, PHURINE, GLUCOSEU, HGBUR, BILIRUBINUR, KETONESUR, PROTEINUR, UROBILINOGEN, NITRITE, LEUKOCYTESUR in the last 168 hours.  Invalid input(s): APPERANCEUR  Lipid Panel:     Component Value Date/Time   TRIG 166* 11/19/2015 0903    HgbA1C: No results found for: HGBA1C  Urine Drug Screen:  No results found for: LABOPIA, COCAINSCRNUR, LABBENZ, AMPHETMU, THCU, LABBARB  Alcohol Level: No results for input(s): ETH in the last 168 hours.  Other results: EKG: junctional rhythm at 29 bpm.  Imaging: Dg Abd 1 View  11/19/2015  CLINICAL DATA:  NG tube placement.  Patient was found unresponsive. EXAM: ABDOMEN - 1 VIEW COMPARISON:  None. FINDINGS: Gaseous distention of the stomach. Gaseous distention of the upper abdominal small bowel. Changes likely to represent obstruction. Fold thickening of the  small bowel could also indicate enteritis. Enteric tube is not visualized within the field of view of this study. IMPRESSION: Abnormal bowel gas pattern with distended stomach and distended small bowel with small bowel fold thickening. Changes could represent obstruction and/or enteritis. Enteric tube is not visualized within the field of view. Electronically Signed   By: Burman NievesWilliam  Stevens M.D.   On: 11/19/2015 04:37   Dg Chest Port 1 View  11/19/2015  CLINICAL DATA:  Cardiac arrest.  Intubation. EXAM: PORTABLE CHEST 1 VIEW COMPARISON:  None. FINDINGS: Endotracheal tube with tip measuring 3.3 cm above the carina. Shallow inspiration. Cardiac enlargement without vascular congestion. There is a focal nodule in the right upper lung measuring 1.9 cm diameter. There is additional nodular consolidation or confluent nodules demonstrated in the right perihilar and infrahilar region. Changes may represent pneumonia although neoplasm should be excluded. Consider CT chest for further evaluation. No blunting of costophrenic angles. No pneumothorax. Visualized bones appear grossly intact. IMPRESSION: Cardiac enlargement. Nodular opacities in the right lung. CT suggested  for further evaluation. Electronically Signed   By: Burman Nieves M.D.   On: 11/19/2015 03:36     Assessment/Plan: 45 year old male presenting s/p cardiac arrest now unresponsive with stimulus related myoclonus.  Concern is for anoxic injury.  Multiple metabolic abnormalities noted as well.  Further work up recommended.  Recommendations: 1.  Head CT without contrast 2.  EEG 3.  Further recommendations concerning prognosis to be made after results of above reviewed.   4.  Keppra  IV now with maintenance of  IV daily  Thana Farr, MD Neurology [phone] 11/19/2015, 11:24 AM

## 2015-11-19 NOTE — Care Management (Addendum)
Found down and unresponsive by his brother.  Patient with esrd and receives hemodialysis.  Patient is intubated and on full ventilaor support.  Late this afternoon found that [facility] is [facility] and family spoke with attending regarding their wish for transfer.  Patient at present is not medically stable. Will reassess for transfer 3/24.     Left message for Linda/Laurie at [facility] and sent H/P.

## 2015-11-19 NOTE — Progress Notes (Signed)
Spoke with Dr V she will contact Dr Elisabeth PigeonVachhani regarding CT results. Paged Dr Cherylann RatelLateef 3 times regarding potassium. Passed on to 3rd shift nurse regarding patients potassium level and she inform MD when he calls back.

## 2015-11-19 NOTE — Progress Notes (Signed)
Spoke with Dr Darnelle Catalanama. Patient is not responding. Will jerk at times and open eyes but not purposeful. Orders to paralyze patient in order to place bite block and then stop propofol. Patient is having oral bleeding and bleeding through subglottis suction.  OG tube not seen per chest xray. Orders to keep it in place and continue LIS suction. Will readdress tomorrow but at this time no feeding or medication through OG due to placement.

## 2015-11-19 NOTE — Consult Note (Signed)
Cardiology Consultation Note  Patient ID: Spencer Lopez, MRN: [id number], DOB/AGE: 1971-07-20 45 y.o. Admit date: 11/19/2015   Date of Consult: 11/19/2015 Primary Physician: No primary care provider on file. Primary Cardiologist: New to [facility]  Chief Complaint: Found down by brother Reason for Consult: CHB,cardiac arrest  HPI: 45 y.o. male with h/o ESRD on HD for unknown reason at this time, HTN, and diabetes who presented to [facility] overnight via EMS s/p cardiac arrest after missing his dialysis session and was found to have CHB likely in the setting of a potassium of >7.5 requiring emergent HD.  Patient is intubated, no family is present at this time. History is taken from prior notes. Patient was found by his brother who started CPR and called 911. Upon EMS arrival this was taken over by them and ROSC was obtained after approximately 10 minutes. In the ED EKG showed CHB with 29 bom, BP 107/56. King airway was in place. Patient's glucose was in the 20's as well. He was noted to have a potassium of >7.5. He received calcium chloride, insulin, and D50. Dialysis line has been placed. He has been started on HD. EEG and echo are pending. Follow up bmet is pending.    Past Medical History  Diagnosis Date  . Diabetes mellitus without complication (HCC)   . Hypertension   . Renal disorder       Most Recent Cardiac Studies: none   Surgical History: History reviewed. No pertinent past surgical history.   Home Meds: Prior to Admission medications   Not on File    Inpatient Medications:  . antiseptic oral rinse  7 mL Mouth Rinse QID  . antiseptic oral rinse  7 mL Mouth Rinse QID  . chlorhexidine gluconate  15 mL Mouth Rinse BID  . chlorhexidine gluconate  15 mL Mouth Rinse BID  . docusate sodium  100 mg Oral BID  . heparin  5,000 Units Subcutaneous 3 times per day  . insulin aspart  0-15 Units Subcutaneous 6 times per day  . levETIRAcetam  1,000 mg Intravenous Once  . levETIRAcetam  500  mg Intravenous Q12H  . pantoprazole (PROTONIX) IV  40 mg Intravenous Q24H  . sodium chloride flush  3 mL Intravenous Q12H   . DOPamine 6 mcg/kg/min (11/19/15 0745)  . propofol (DIPRIVAN) infusion 15 mcg/kg/min (11/19/15 0725)    Allergies: No Known Allergies  Social History   Social History  . Marital Status: Single    Spouse Name: N/A  . Number of Children: N/A  . Years of Education: N/A   Occupational History  . Not on file.   Social History Main Topics  . Smoking status: Unknown If Ever Smoked  . Smokeless tobacco: Not on file  . Alcohol Use: Not on file  . Drug Use: Not on file  . Sexual Activity: Not on file   Other Topics Concern  . Not on file   Social History Narrative  . No narrative on file     History reviewed. No pertinent family history.  Patient intubated  Review of Systems: Review of Systems  Unable to perform ROS: intubated    Labs:  Recent Labs  11/19/15 0250  TROPONINI 0.17*   Lab Results  Component Value Date   WBC 18.4* 11/19/2015   HGB 12.1* 11/19/2015   HCT 38.7* 11/19/2015   MCV 85.3 11/19/2015   PLT 257 11/19/2015    Recent Labs Lab 11/19/15 0250  NA 128*  K >7.5*  CL 92*  CO2 13*  BUN 113*  CREATININE 15.52*  CALCIUM 9.0  GLUCOSE 188*   Lab Results  Component Value Date   TRIG 166* 11/19/2015   No results found for: DDIMER  Radiology/Studies:  Dg Abd 1 View  11/19/2015  CLINICAL DATA:  NG tube placement.  Patient was found unresponsive. EXAM: ABDOMEN - 1 VIEW COMPARISON:  None. FINDINGS: Gaseous distention of the stomach. Gaseous distention of the upper abdominal small bowel. Changes likely to represent obstruction. Fold thickening of the small bowel could also indicate enteritis. Enteric tube is not visualized within the field of view of this study. IMPRESSION: Abnormal bowel gas pattern with distended stomach and distended small bowel with small bowel fold thickening. Changes could represent obstruction and/or  enteritis. Enteric tube is not visualized within the field of view. Electronically Signed   By: Burman NievesWilliam  Stevens M.D.   On: 11/19/2015 04:37   Dg Chest Port 1 View  11/19/2015  CLINICAL DATA:  Cardiac arrest.  Intubation. EXAM: PORTABLE CHEST 1 VIEW COMPARISON:  None. FINDINGS: Endotracheal tube with tip measuring 3.3 cm above the carina. Shallow inspiration. Cardiac enlargement without vascular congestion. There is a focal nodule in the right upper lung measuring 1.9 cm diameter. There is additional nodular consolidation or confluent nodules demonstrated in the right perihilar and infrahilar region. Changes may represent pneumonia although neoplasm should be excluded. Consider CT chest for further evaluation. No blunting of costophrenic angles. No pneumothorax. Visualized bones appear grossly intact. IMPRESSION: Cardiac enlargement. Nodular opacities in the right lung. CT suggested for further evaluation. Electronically Signed   By: Burman NievesWilliam  Stevens M.D.   On: 11/19/2015 03:36    EKG: CHB, 29 bpm  Weights: Filed Weights   11/19/15 0500 11/19/15 0520 11/19/15 0915  Weight: 175 lb 7.8 oz (79.6 kg) 180 lb 12.4 oz (82 kg) 180 lb 12.4 oz (82 kg)     Physical Exam: Blood pressure 137/67, pulse 89, temperature 99.3 F (37.4 C), resp. rate 14, height 5\' 8"  (1.727 m), weight 180 lb 12.4 oz (82 kg), SpO2 100 %. Body mass index is 27.49 kg/(m^2). General: Critically ill appearing. Head: Normocephalic, atraumatic, sclera non-icteric, no xanthomas, nares are without discharge.  Neck: Negative for carotid bruits. JVD not elevated. Lungs: Intubated, decreased bilaterally. Heart: RRR with S1 S2. No murmurs, rubs, or gallops appreciated. Abdomen: Soft, non-tender, non-distended with normoactive bowel sounds. No hepatomegaly. No rebound/guarding. No obvious abdominal masses. Msk:  Strength and tone appear normal for age. Extremities: No clubbing or cyanosis. No edema.  Distal pedal pulses are 2+ and equal  bilaterally. Neuro: Intubated. Psych:  Intubated.    Assessment and Plan:   1. Cardiac/respiratory arrest: -Likely in the setting of metabolic abnormalities 2/2 HD noncompliance with hyperkalemia requiring emergent dialysis  -Status post HD this morning -Follow up labs pending this morning -Wean vent as/if able -Check echo to evaluate EF and wall motion  2. Anoxic brain injury/metabolic encephalopathy: -Unknown down time -EEG pending -?neuro  3. ESRD on HD: -No records at this time, perhaps primary team could look into locating these -Per renal   4. Overall poor prognosis   Signed, Eula ListenRyan Dunn, PA-C Pager: [phone] 11/19/2015, 10:40 AM  Devestating series of events resulting in asystolic arrest in the context of failure to do dialysis and elevated K  Will need to check echo  Neurological prognosis is necessary but I worry given clonic jerks...  Will follow   No family

## 2015-11-19 NOTE — Progress Notes (Signed)
Paged Dr Elisabeth PigeonVachhani regarding patients mother to [facility]. Dr Esaw GrandchildVachanni will come and speak with family. Also spoke with Dt Lateef patient has only made about 100cc of urine throughout the day. No new orders given.

## 2015-11-19 NOTE — Progress Notes (Signed)
Pharmacy Antibiotic Note  Spencer Lopez is a 45 y.o. male with ESRD on HD admitted on 11/19/2015 for cardiac arrest.  Pharmacy has been consulted to dose zosyn for HCAP/empiric coverage  Plan: Ordered zosyn 3.375gm IV Q12H  Height: 5\' 8"  (172.7 cm) Weight: 180 lb 12.4 oz (82 kg) IBW/kg (Calculated) : 68.4  Temp (24hrs), Avg:96.3 F (35.7 C), Min:91.9 F (33.3 C), Max:99.3 F (37.4 C)   Recent Labs Lab 11/19/15 0250 11/19/15 0903  WBC 18.4* 17.0*  CREATININE 15.52* 7.19*    Estimated Creatinine Clearance: 12.4 mL/min (by C-G formula based on Cr of 7.19).    No Known Allergies  Antimicrobials this admission: 3/23 zosyn >>   Dose adjustments this admission:   Microbiology results: 3/23 MRSA PCR: negative  Thank you for allowing pharmacy to be a part of this patient's care.  Teion Ballin C 11/19/2015 1:00 PM

## 2015-11-19 NOTE — Progress Notes (Signed)
HD tx started  

## 2015-11-19 NOTE — ED Notes (Signed)
Pt presents to ED via EMS unresponsive at arrival with HR in 30s, ongoing CPR by [facility]. Pt found unresponsive. Family pt went to bed well last night; family report that pt missed dialysis on Tuesday ([facility]) and he is due for dialysis today. Pt reported to bed drinking a lot of orange juice.

## 2015-11-19 NOTE — Progress Notes (Signed)
[facility]  ROUNDING NOTE   Subjective:  Called emergently to see patient. Apparently he hasn't taken insulin at home per report. He missed dialysis on Saturday but apparently went on Tuesday. However no way to confirm this at the moment given the early hour. Potassium very high at greater than 7.5. When EMS arrived at his home he was apparently in asystole. After being coded patient found to be bradycardic. He is currently being paced in the emergency department. We are asked to evaluate him for urgent dialysis.    Objective:  Vital signs in last 24 hours:  Pulse Rate:  [27-102] 79 (03/23 0344) Resp:  [13-30] 24 (03/23 0344) BP: (90-132)/(51-81) 127/61 mmHg (03/23 0340) SpO2:  [90 %-100 %] 100 % (03/23 0344) FiO2 (%):  [100 %] 100 % (03/23 0337) Weight:  [82 kg (180 lb 12.4 oz)] 82 kg (180 lb 12.4 oz) (03/23 0400)  Weight change:  Filed Weights   11/19/15 0400  Weight: 82 kg (180 lb 12.4 oz)    Intake/Output:     Intake/Output this shift:     Physical Exam: General: Critically ill appaering  Head: Normocephalic, atraumatic. Moist oral mucosal membranes, tongue laceration noted  Eyes: Anicteric, pupils nonreactive  Neck: Supple, trachea midline  Lungs:  Clear to auscultation normal effort  Heart: S1S2 paced rhythm  Abdomen:  Soft, nontender, BS present  Extremities: trace peripheral edema.  Neurologic: Currently on sedation  Skin: No lesions  Access: LUE AVF    Basic Metabolic Panel:  Recent Labs Lab 11/19/15 0250  NA 128*  K >7.5*  CL 92*  CO2 13*  GLUCOSE 188*  BUN 113*  CREATININE 15.52*  CALCIUM 9.0    Liver Function Tests: No results for input(s): AST, ALT, ALKPHOS, BILITOT, PROT, ALBUMIN in the last 168 hours. No results for input(s): LIPASE, AMYLASE in the last 168 hours. No results for input(s): AMMONIA in the last 168 hours.  CBC:  Recent Labs Lab 11/19/15 0250  WBC 18.4*  HGB 12.1*  HCT 38.7*  MCV 85.3  PLT 257     Cardiac Enzymes:  Recent Labs Lab 11/19/15 0250  TROPONINI 0.17*    BNP: Invalid input(s): POCBNP  CBG:  Recent Labs Lab 11/19/15 0307  GLUCAP 99    Microbiology: No results found for this or any previous visit.  Coagulation Studies: No results for input(s): LABPROT, INR in the last 72 hours.  Urinalysis: No results for input(s): COLORURINE, LABSPEC, PHURINE, GLUCOSEU, HGBUR, BILIRUBINUR, KETONESUR, PROTEINUR, UROBILINOGEN, NITRITE, LEUKOCYTESUR in the last 72 hours.  Invalid input(s): APPERANCEUR    Imaging: Dg Chest Port 1 View  11/19/2015  CLINICAL DATA:  Cardiac arrest.  Intubation. EXAM: PORTABLE CHEST 1 VIEW COMPARISON:  None. FINDINGS: Endotracheal tube with tip measuring 3.3 cm above the carina. Shallow inspiration. Cardiac enlargement without vascular congestion. There is a focal nodule in the right upper lung measuring 1.9 cm diameter. There is additional nodular consolidation or confluent nodules demonstrated in the right perihilar and infrahilar region. Changes may represent pneumonia although neoplasm should be excluded. Consider CT chest for further evaluation. No blunting of costophrenic angles. No pneumothorax. Visualized bones appear grossly intact. IMPRESSION: Cardiac enlargement. Nodular opacities in the right lung. CT suggested for further evaluation. Electronically Signed   By: Burman NievesWilliam  Stevens M.D.   On: 11/19/2015 03:36     Medications:   . DOPamine 20 mcg/kg/min (11/19/15 0305)  . propofol (DIPRIVAN) infusion       atropine, dextrose, DOPamine, etomidate, rocuronium,  sodium bicarbonate  Assessment/ Plan:  45 y.o. male h/o type 2 diabetes (retinopathy, nephrotic range proteinuria), HTN, hyperlipidemia, tobacco use (1ppd) and ESRD now on hemodialysis, presents now with severe hyperkalemia, cardiac arrest, respiratory failure.  1.  ESRD on HD, missed 1 HD recently.  The patient apparently missed hemodialysis on Saturday but attended dialysis  on Tuesday. No weight or independently confirmed this at the moment given the early hour. Proceed with emergent dialysis in any case given severe hyperkalemia.  2.  Severe hyperkalemia.  Presented with cardiac arrest.  Most likely associated with recent missed dialysis. Patient currently with paced rhythm. Potassium greater than 7.5. Proceed with urgent dialysis at the bedside in the critical care unit as above.  3.  Anemia of CKD. Hemoglobin currently 12.1. No indication for Procrit.  4.  Secondary hyperparathyroidism.  Patient with history of severe hyperphosphatemia as an outpatient. Check intact PTH and phosphorus with dialysis today.  5. Overall prognosis quite guarded.    LOS: 0 Spencer Lopez 3/23/20174:07 AM

## 2015-11-19 NOTE — Progress Notes (Signed)
Report given to [place] at [facility]. [facility] contacted and estimates having a truck enroute in about 90 min.

## 2015-11-19 NOTE — Progress Notes (Signed)
eLink Physician-Brief Progress Note Patient Name: Spencer ParisRoosevelt Lopez DOB: 03/15/1971 MRN: [id number]   Date of Service  11/19/2015  HPI/Events of Note  Bedside nurse contacted regarding negative CT scan for intracranial bleeding. Patient with status epilepticus. Coagulopathy receiving Kcentra. Worsening hypotension on vasopressor infusion. End-stage renal disease with hyperkalemia needing dialysis  eICU Interventions  1. Requesting bed placement preferably in 3 midwest for continuous EEG monitoring 2. Dr. Nicholos Johnsamachandran in to discuss transfer with patient's mother at bedsidegiven multiple medical problems. 3. Escalating doses of vasopressor infusion to maintain mean arterial pressure. 4. Holding on hemodialysis at this time pending potential transfer.     Intervention Category Major Interventions: Seizures - evaluation and management  Lawanda CousinsJennings Kamika Goodloe 11/19/2015, 7:38 PM

## 2015-11-19 NOTE — Progress Notes (Signed)
Dr Cherylann RatelLateef aware of patients bun and creatinine. At this point no additional orders.

## 2015-11-19 NOTE — Progress Notes (Signed)
Asked Dr Darnelle Catalanama to come into patients room. Patient is bleeding from his nostrils, mouth, penis, right groin where central line placed. He is aware of patients APTT and INR. We have applied pressure, reinforced dressing. Vit K and platlets ordered. Awaiting for blood bank to call me to transfuse FFP and awaiting pharmacy to send vit K. At this point MD stated to stop giving Ativan as per Neurologist these jerks are myoclonic jerks. Started on keppra.

## 2015-11-19 NOTE — Progress Notes (Signed)
[facility] Physicians -  at [facility]   PATIENT NAME: Spencer Lopez    MR#:  [id number]  DATE OF BIRTH:  04-26-1971  SUBJECTIVE:  CHIEF COMPLAINT:   Chief Complaint  Patient presents with  . Respiratory Arrest  . Bradycardia    S/p cardiac arrest, intubated, also had seizures.   He is bleeding all over the body from each insertion site and catheters.   Receiving FFP and Plt.  REVIEW OF SYSTEMS:   On vent support.  ROS  DRUG ALLERGIES:  No Known Allergies  VITALS:  Blood pressure 108/58, pulse 84, temperature 99.4 F (37.4 C), temperature source Axillary, resp. rate 0, height  (1.727 m), weight 82 kg (180 lb 12.4 oz), SpO2 100 %.  PHYSICAL EXAMINATION:  GENERAL:  45 y.o.-year-old patient lying in the bed critically ill.  EYES: Pupils equal, round, reactive to light . No scleral icterus.   HEENT: Head atraumatic, normocephalic. Oropharynx and nasopharynx clear. ETT in place.  NECK:  Supple, no jugular venous distention. No thyroid enlargement, LUNGS: Normal breath sounds bilaterally, no wheezing,or crepitation. No use of accessory muscles of respiration.    On ventilator support. CARDIOVASCULAR: S1, S2 normal. No murmurs, rubs, or gallops.  ABDOMEN: Soft, nondistended. Bowel sounds present. No organomegaly or mass.  EXTREMITIES: No pedal edema, cyanosis, or clubbing. Left leg- intra oscious line present, right groin Central line present. NEUROLOGIC: s/p cardiac arrest, not responsive, on vent support. PSYCHIATRIC: The patient is comatose.  SKIN: bleed from line insertion sites.  Physical Exam LABORATORY PANEL:   CBC  Recent Labs Lab 11/19/15 0903  WBC 17.0*  HGB 11.3*  HCT 33.9*  PLT 232   ------------------------------------------------------------------------------------------------------------------  Chemistries   Recent Labs Lab 11/19/15 0903  NA 134*  K 4.1  CL 96*  CO2 21*  GLUCOSE 115*  BUN 55*  CREATININE  7.19*  CALCIUM 8.0*  AST 381*  ALT 232*  ALKPHOS 278*  BILITOT 1.0   ------------------------------------------------------------------------------------------------------------------  Cardiac Enzymes  Recent Labs Lab 11/19/15 0250  TROPONINI 0.17*   ------------------------------------------------------------------------------------------------------------------  RADIOLOGY:  Dg Abd 1 View  11/19/2015  CLINICAL DATA:  NG tube placement.  Patient was found unresponsive. EXAM: ABDOMEN - 1 VIEW COMPARISON:  None. FINDINGS: Gaseous distention of the stomach. Gaseous distention of the upper abdominal small bowel. Changes likely to represent obstruction. Fold thickening of the small bowel could also indicate enteritis. Enteric tube is not visualized within the field of view of this study. IMPRESSION: Abnormal bowel gas pattern with distended stomach and distended small bowel with small bowel fold thickening. Changes could represent obstruction and/or enteritis. Enteric tube is not visualized within the field of view. Electronically Signed   By: Burman Nieves M.D.   On: 11/19/2015 04:37   Dg Chest Port 1 View  11/19/2015  CLINICAL DATA:  Cardiac arrest.  Intubation. EXAM: PORTABLE CHEST 1 VIEW COMPARISON:  None. FINDINGS: Endotracheal tube with tip measuring 3.3 cm above the carina. Shallow inspiration. Cardiac enlargement without vascular congestion. There is a focal nodule in the right upper lung measuring 1.9 cm diameter. There is additional nodular consolidation or confluent nodules demonstrated in the right perihilar and infrahilar region. Changes may represent pneumonia although neoplasm should be excluded. Consider CT chest for further evaluation. No blunting of costophrenic angles. No pneumothorax. Visualized bones appear grossly intact. IMPRESSION: Cardiac enlargement. Nodular opacities in the right lung. CT suggested for further evaluation. Electronically Signed   By: Burman Nieves  M.D.   On:  11/19/2015 03:36    ASSESSMENT AND PLAN:   Active Problems:   Cardiac arrest (HCC)  1. Cardiac arrest: Return perfusion achieved with the patient is now on ventilator, BP stable.   Reason was Hyperkalemia. 2. Hyperkalemia:    Received emergent HD. 3. ESRD on dialysis: nephrology on case, received HD. 4. Diabetes mellitus type 2: insulin sliding scale. 5. DIC   Pt is on coumadin as per mother, his INR was > 9.   May be combination of shock after CPR also.   Vit K, FFP and Plt ordered by critical care. 6. Seizures   May be due to anoxic injuries or intra cranial hemorrhage due to high INR>   Appreciated neuro consult on keppra   Will get CT head after FFP and PLt transfusion  I discussed in detail about his condition and prognosis with his mother and father. [place] [facility] is unstable to day, but [facility], and will follow tomorrow.  All the records are reviewed and case discussed with Care Management/Social Workerr. Management plans discussed with the patient, family and they are in agreement.  CODE STATUS: Full.  TOTAL TIME TAKING CARE OF THIS PATIENT: 60 critical care minutes.     POSSIBLE D/C IN ? DAYS, DEPENDING ON CLINICAL CONDITION.   Altamese DillingVACHHANI, Ayanni Tun M.D on 11/19/2015   Between 7am to 6pm - Pager - [phone]  After 6pm go to [email or web address] - password [email or web address] [facility]  [facility]  Office  [phone]  CC: Primary care physician; No primary care provider on file.  Note: This dictation was prepared with Dragon dictation along with smaller phrase technology. Any transcriptional errors that result from this process are unintentional.

## 2015-11-19 NOTE — Discharge Summary (Signed)
[facility] - [facility]   PATIENT NAME: Spencer Lopez    MR#:  [id number]  DATE OF BIRTH:  September 06, 1970  DATE OF ADMISSION:  11/19/2015 ADMITTING PHYSICIAN: Arnaldo Natal, MD  DATE OF DISCHARGE: 11/19/2015  PRIMARY CARE PHYSICIAN: No primary care provider on file.    ADMISSION DIAGNOSIS:  Cardiac arrest (HCC) [I46.9] Complete heart block (HCC) [I44.2] Hyperkalemia [E87.5] Acute pulmonary edema (HCC) [J81.0]  DISCHARGE DIAGNOSIS:  Active Problems:   Cardiac arrest (HCC)   Status epilapticus  SECONDARY DIAGNOSIS:   Past Medical History  Diagnosis Date  . Diabetes mellitus without complication (HCC)   . Hypertension   . Renal disorder     HOSPITAL COURSE:   1. Cardiac arrest: Return perfusion achieved with the patient is now on ventilator, BP stable.  Reason was Hyperkalemia.   Continue vent support, may need to assess for brain injury. 2. Hyperkalemia:   Received emergent HD.    Again K is rising- Nephro suggest to do HD again. 3. ESRD on dialysis: nephrology on case, received HD. 4. Diabetes mellitus type 2: insulin sliding scale. 5. DIC/ coagulopathy  Pt is on coumadin as per mother, his INR was > 9.  May be combination of shock after CPR also.  Vit K, FFP and Plt ordered by critical care.   Also given [place].    INR came down. 6. Seizures  May be due to anoxic injuries , as per neuro- EEG showed status epilapticus.    Need continuous EEG monitoring in ICU.  Appreciated neuro consult on keppra, also loaded with dilantin.   CT head negative for bleed or edema.   DISCHARGE CONDITIONS:   critical  CONSULTS OBTAINED:  Treatment Team:  Shane Crutch, MD Antonieta Iba, MD Kym Groom, MD Thana Farr, MD  DRUG ALLERGIES:  No Known Allergies  DISCHARGE MEDICATIONS:   Current Discharge Medication List    START taking these medications   Details  DOPamine (INTROPIN) 3.2-5 MG/ML-%  infusion Inject 0-1.64 mg/min into the vein continuous. Qty: 250 mL, Refills: 0    fentaNYL (SUBLIMAZE) 100 MCG/2ML injection Inject 2 mLs (100 mcg total) into the vein every 15 (fifteen) minutes as needed (to achieve RASS goal.). Qty: 2 mL, Refills: 0    insulin aspart (NOVOLOG) 100 UNIT/ML injection Inject 0-15 Units into the skin every 4 (four) hours. Qty: 10 mL, Refills: 11    levETIRAcetam 500 mg in sodium chloride 0.9 % 100 mL Inject 500 mg into the vein daily. Qty: 250 mL, Refills: 0    LORazepam (ATIVAN) 2 MG/ML injection Inject 1-2 mLs (2-4 mg total) into the vein every 10 (ten) minutes as needed (seizures). Qty: 1 mL, Refills: 0    pantoprazole (PROTONIX) 40 MG injection Inject 40 mg into the vein every 12 (twelve) hours. Qty: 1 each, Refills: 0    piperacillin-tazobactam (ZOSYN) 3.375 GM/50ML IVPB Inject 50 mLs (3.375 g total) into the vein every 12 (twelve) hours. Qty: 50 mL, Refills: 0    propofol (DIPRIVAN) 1000 MG/100ML EMUL injection Inject 410-6,560 mcg/min into the vein continuous. Qty: 100 mL, Refills: 0         DISCHARGE INSTRUCTIONS:    As per .  If you experience worsening of your admission symptoms, develop shortness of breath, life threatening emergency, suicidal or homicidal thoughts you must seek medical attention immediately by calling 911 or calling your MD immediately  if symptoms less severe.  You Must read complete instructions/literature along  with all the possible adverse reactions/side effects for all the Medicines you take and that have been prescribed to you. Take any new Medicines after you have completely understood and accept all the possible adverse reactions/side effects.   Please note  You were cared for by a hospitalist during your hospital stay. If you have any questions about your discharge medications or the care you received while you were in the hospital after you are discharged, you can call the unit and asked to speak  with the hospitalist on call if the hospitalist that took care of you is not available. Once you are discharged, your primary care physician will handle any further medical issues. Please note that NO REFILLS for any discharge medications will be authorized once you are discharged, as it is imperative that you return to your primary care physician (or establish a relationship with a primary care physician if you do not have one) for your aftercare Lopez so that they can reassess your need for medications and monitor your lab values.    Today   CHIEF COMPLAINT:   Chief Complaint  Patient presents with  . Respiratory Arrest  . Bradycardia    HISTORY OF PRESENT ILLNESS:  Spencer Lopez  is a 45 y.o. male presents to the emergency department via EMS after dispensing a cardiac arrest. Past medical history significant for end-stage renal disease on hemodialysis. The patient reportedly missed a dialysis treatment. In route he received CPR. Return circulation was achieved and the emergency department staff attempted to shift his potassium stabilized his heart rhythm. Following intubation and sedation the emergency department staff called the hospitalist service for admission.    VITAL SIGNS:  Blood pressure 102/55, pulse 80, temperature 100.3 F (37.9 C), temperature source Axillary, resp. rate 18, height  (1.727 m), weight 82 kg (180 lb 12.4 oz), SpO2 100 %.  I/O:   Intake/Output Summary (Last 24 hours) at 11/19/15 2100 Last data filed at 11/19/15 1936  Gross per 24 hour  Intake  130.5 ml  Output    385 ml  Net -254.5 ml    PHYSICAL EXAMINATION:   GENERAL: 45 y.o.-year-old patient lying in the bed critically ill.  EYES: Pupils equal, round, reactive to light . No scleral icterus.  HEENT: Head atraumatic, normocephalic. Oropharynx and nasopharynx clear. ETT in place.  NECK: Supple, no jugular venous distention. No thyroid enlargement, LUNGS: Normal breath sounds  bilaterally, no wheezing,or crepitation. No use of accessory muscles of respiration.   On ventilator support. CARDIOVASCULAR: S1, S2 normal. No murmurs, rubs, or gallops.  ABDOMEN: Soft, nondistended. Bowel sounds present. No organomegaly or mass.  EXTREMITIES: No pedal edema, cyanosis, or clubbing. Left leg- intra oscious line present, right groin Central line present. NEUROLOGIC: s/p cardiac arrest, not responsive, on vent support. PSYCHIATRIC: The patient is comatose.  SKIN: bleed from line insertion sites.   DATA REVIEW:   CBC  Recent Labs Lab 11/19/15 1741  WBC 13.5*  HGB 7.2*  HCT 22.0*  PLT 173    Chemistries   Recent Labs Lab 11/19/15 0903 11/19/15 1741  NA 134* 131*  K 4.1 6.3*  CL 96* 92*  CO2 21* 21*  GLUCOSE 115* 180*  BUN 55* 69*  CREATININE 7.19* 9.76*  CALCIUM 8.0* 7.4*  AST 381*  --   ALT 232*  --   ALKPHOS 278*  --   BILITOT 1.0  --     Cardiac Enzymes  Recent Labs Lab 11/19/15 0250  TROPONINI 0.17*  Microbiology Results  Results for orders placed or performed during the hospital encounter of 11/19/15  MRSA PCR Screening     Status: None   Collection Time: 11/19/15  5:27 AM  Result Value Ref Range Status   MRSA by PCR NEGATIVE NEGATIVE Final    Comment:        The GeneXpert MRSA Assay (FDA approved for NASAL specimens only), is one component of a comprehensive MRSA colonization surveillance program. It is not intended to diagnose MRSA infection nor to guide or monitor treatment for MRSA infections.     RADIOLOGY:  Dg Abd 1 View  11/19/2015  CLINICAL DATA:  NG tube placement.  Patient was found unresponsive. EXAM: ABDOMEN - 1 VIEW COMPARISON:  None. FINDINGS: Gaseous distention of the stomach. Gaseous distention of the upper abdominal small bowel. Changes likely to represent obstruction. Fold thickening of the small bowel could also indicate enteritis. Enteric tube is not visualized within the field of view of this study.  IMPRESSION: Abnormal bowel gas pattern with distended stomach and distended small bowel with small bowel fold thickening. Changes could represent obstruction and/or enteritis. Enteric tube is not visualized within the field of view. Electronically Signed   By: Burman NievesWilliam  Stevens M.D.   On: 11/19/2015 04:37   Ct Head Wo Contrast  11/19/2015  CLINICAL DATA:  Post cardiac arrest.  Intubated.  Unresponsive. EXAM: CT HEAD WITHOUT CONTRAST TECHNIQUE: Contiguous axial images were obtained from the base of the skull through the vertex without intravenous contrast. COMPARISON:  None FINDINGS: Ventricle size is normal. Negative for acute or chronic infarction. Negative for hemorrhage or fluid collection. Negative for mass or edema. No shift of the midline structures. Calvarium is intact. Mucosal edema in the paranasal sinuses.  Air-fluid levels.  NG tube. IMPRESSION: Normal CT of the brain Sinusitis Electronically Signed   By: Marlan Palauharles  Clark M.D.   On: 11/19/2015 18:26   Dg Chest Port 1 View  11/19/2015  CLINICAL DATA:  Cardiac arrest.  Intubation. EXAM: PORTABLE CHEST 1 VIEW COMPARISON:  None. FINDINGS: Endotracheal tube with tip measuring 3.3 cm above the carina. Shallow inspiration. Cardiac enlargement without vascular congestion. There is a focal nodule in the right upper lung measuring 1.9 cm diameter. There is additional nodular consolidation or confluent nodules demonstrated in the right perihilar and infrahilar region. Changes may represent pneumonia although neoplasm should be excluded. Consider CT chest for further evaluation. No blunting of costophrenic angles. No pneumothorax. Visualized bones appear grossly intact. IMPRESSION: Cardiac enlargement. Nodular opacities in the right lung. CT suggested for further evaluation. Electronically Signed   By: Burman NievesWilliam  Stevens M.D.   On: 11/19/2015 03:36    Management plans discussed with the patient, family and they are in agreement.  CODE STATUS:     Code Status  Orders        Start     Ordered   11/19/15 0507  Full code   Continuous     11/19/15 0506    Code Status History    Date Active Date Inactive Code Status Order ID Comments User Context   This patient has a current code status but no historical code status.      TOTAL TIME TAKING CARE OF THIS PATIENT: 90 critical care minutes in whole day.    Altamese DillingVACHHANI, Shevy Yaney M.D on 11/19/2015 at 9:00 PM  Between 7am to 6pm - Pager - [phone]  After 6pm go to [email or web address] - Scientist, research (life sciences)password [email or web address]  Office  [phone]  CC: Primary care physician; No primary care provider on file.   Note: This dictation was prepared with Dragon dictation along with smaller phrase technology. Any transcriptional errors that result from this process are unintentional.

## 2015-11-19 NOTE — Progress Notes (Signed)
Pt nonresponsive and hypothermic upon arrival to ICU. Externally paced at 80ppm; now off.  HD started at bedside. Pupils nonreactive, no gag reflex present, no movement to painful stimuli.  Bair hugger applied, NGT to suction, SR with 1st degree HB. Report given to Dois DavenportSandra, [place]RN.

## 2015-11-19 NOTE — Progress Notes (Signed)
Hemodialysis completed. 

## 2015-11-19 NOTE — Progress Notes (Addendum)
Asked to evaluate during rounds by RN due to possible seizure activity. Pt noted to have tonic/clonic activity periodically. EEG previously ordered.  Discussed with neuro, RN, multidisciplinary team, will give keppra and obtain CT head (previously deferred due to need for emergent dialysis) when more stable.   11:30 am. Evaluated patient again due to persistent bleeding from IV site, oral cavity, nasal cavity, foley insertion site. Stat CBC, INR, CMP, DIC panel ordered. Cbc showed normal platelet level. D/w ICU pharmacy, will order DDAVPx 20 mcg for possible uremic bleeding.  12:20 pm: Family updated, prognosis poor, discussed further with neurology, no response to Keppra, has received 12 mg of ativan with continue twitching, suspect that this is myoclonus. Will await EEG will also get CT head.  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 Reviewed coags ordered stat, which show INR > 7. Transaminitis likely c/w hypotension.  P: Ordered stat vitamin K, FFP.  --Start empiric zosyn.   Lavaughn Haberle.  11/19/2015   Critical Care Attestation.  I have personally obtained a history, examined the patient, evaluated laboratory and imaging results, formulated the assessment and plan and placed orders. The Patient requires high complexity decision making for assessment and support, frequent evaluation and titration of therapies, application of advanced monitoring technologies and extensive interpretation of multiple databases. The patient has critical illness that could lead imminently to failure of 1 or more organ systems and requires the highest level of physician preparedness to intervene.  Critical Care Time devoted to patient care services described in this note is 35 minutes and is exclusive of time spent in procedures.

## 2015-11-19 NOTE — Progress Notes (Signed)
°   11/19/15 0400  Clinical Encounter Type  Visited With Family  Visit Type Initial  Referral From Nurse  Consult/Referral To Chaplain  Spiritual Encounters  Spiritual Needs Emotional  Stress Factors  Family Stress Factors Health changes  Pastoral care and support of family members.  Patient moved from ED to ICU. Chaplain [employer]elyn Crews Ext [phone]

## 2015-11-19 NOTE — Progress Notes (Signed)
eLink Physician-Brief Progress Note Patient Name: Spencer ParisRoosevelt Lopez DOB: 02/17/1971 MRN: [id number]   Date of Service  11/19/2015   NEW PATIENT EVAL FOR CARDIAC ARREST Likely from severe acidosis  eICU Interventions  NEW patient evaluation: 45 yo male found unresponsive at home with unknown downtime EMS called by family and  CPR started for ASYSTOLE approx 10 mins.Patient temp on arrival was already 30 degrees after discussion with ICU nurse  After further discussion with ICU nurse, patient DOES NOT MEET HYPOTHERMIA PROTOCOL DUE TO UNFAVORABLE CARDIAC ARREST SCENARIO          Shourya Macpherson 11/19/2015, 5:42 AM

## 2015-11-19 NOTE — Code Documentation (Signed)
Paced at 

## 2015-11-19 NOTE — Progress Notes (Signed)
For High INR- Kcentra given. Repeat INR Came down.  CT head is negative for bleed, so pt need to be transfer as per Neurologist recommendation. Due to having status epilapticus- need Continuous EEG monitoring.  I spoke to Dr, Melissa Noonaymonds and Dr. Ardyth Manam about this transfer.  Dr. Ardyth Manam already spoke to E link, and [facility]  Meanwhile pt may receive Hemodialysis as K is rising again, or [facility].

## 2015-11-19 NOTE — Progress Notes (Signed)
Pre-hd tx 

## 2015-11-19 NOTE — Progress Notes (Signed)
Per Dr Ardyth Manam Continue to use OG LIS but no feeds or medication down the OG due to placement.

## 2015-11-19 NOTE — ED Provider Notes (Signed)
[facility] Emergency Department Provider Note  ____________________________________________  Time seen: 2:40 AM  I have reviewed the triage vital signs and the nursing notes.  History physical exam limited secondary to cardiac arrest HISTORY  Chief Complaint Respiratory Arrest and Bradycardia     HPI Spencer Lopez is a 45 y.o. male with history of end-stage renal disease hypertension and diabetes presents via EMS status post cardiac arrest. Per EMS on arrival patient was asystolic and CPR was initiated by the patient's brother EMS continued CPR and subsequent rhythm was pulses electrical activity. EMS states that after approximately about 10 minutes of CPR return of spontaneous circulation was achieved. Patient left the emergency department with a heart rate of 29 blood pressure 107/56 gurgling respirations King airway in place. Per patient's family and the patient missed dialysis on Tuesday. We were also informed by EMS and the family and the patient refuses to take his insulin. Patient's family states that he had an episode of hypoglycemia with a glucose in the 20s last week     Past Medical History  Diagnosis Date  . Diabetes mellitus without complication (HCC)   . Hypertension   . Renal disorder     Patient Active Problem List   Diagnosis Date Noted  . Cardiac arrest (HCC) 11/19/2015    History reviewed. No pertinent past surgical history.  No current outpatient prescriptions on file.  Allergies No known drug allergies History reviewed. No pertinent family history.  Social History Social History  Substance Use Topics  . Smoking status: Unknown If Ever Smoked  . Smokeless tobacco: None  . Alcohol Use: None    Review of Systems  Constitutional: Negative for fever. Eyes: Negative for visual changes. ENT: Negative for sore throat. Cardiovascular: Negative for chest pain. Respiratory: Negative for shortness of  breath. Gastrointestinal: Negative for abdominal pain, vomiting and diarrhea. Genitourinary: Negative for dysuria. Musculoskeletal: Negative for back pain. Skin: Negative for rash. Neurological: Negative for headaches, focal weakness or numbness.  10-point ROS otherwise negative.  ____________________________________________   PHYSICAL EXAM:  VITAL SIGNS: ED Triage Vitals  Enc Vitals Group     BP 11/19/15 0246 132/54 mmHg     Pulse Rate 11/19/15 0246 71     Resp 11/19/15 0251 13     Temp --      Temp src --      SpO2 11/19/15 0246 100 %     Weight 11/19/15 0400 180 lb 12.4 oz (82 kg)     Height --      Head Cir --      Peak Flow --      Pain Score --      Pain Loc --      Pain Edu? --      Excl. in GC? --      Constitutional: Unresponsive Eyes: Conjunctivae are normal. PERRL.  ENT   Head: Normocephalic and atraumatic.   Nose: No congestion/rhinnorhea.   Mouth/Throat: Mucous membranes are moist.   Neck: No stridor. Hematological/Lymphatic/Immunilogical: No cervical lymphadenopathy. Cardiovascular: Normal rate, regular rhythm. Normal and symmetric distal pulses are present in all extremities. No murmurs, rubs, or gallops. Respiratory: Gurgling respirations agonal. Diffuse bibasilar rales  Gastrointestinal: Soft and nontender. No distention. There is no CVA tenderness. Genitourinary: deferred Musculoskeletal: Nontender with normal range of motion in all extremities. No joint effusions.  No lower extremity tenderness nor edema. Neurologic: Unresponsive  Skin:  Skin is warm, dry and intact. No rash noted.   ____________________________________________  LABS (pertinent positives/negatives)  Labs Reviewed  BASIC METABOLIC PANEL - Abnormal; Notable for the following:    Sodium 128 (*)    Potassium >7.5 (*)    Chloride 92 (*)    CO2 13 (*)    Glucose, Bld 188 (*)    BUN 113 (*)    Creatinine, Ser 15.52 (*)    GFR calc non Af Amer 3 (*)    GFR  calc Af Amer 4 (*)    Anion gap 23 (*)    All other components within normal limits  CBC - Abnormal; Notable for the following:    WBC 18.4 (*)    Hemoglobin 12.1 (*)    HCT 38.7 (*)    MCHC 31.2 (*)    RDW 16.0 (*)    All other components within normal limits  TROPONIN I - Abnormal; Notable for the following:    Troponin I 0.17 (*)    All other components within normal limits  BLOOD GAS, ARTERIAL - Abnormal; Notable for the following:    pH, Arterial 7.21 (*)    pO2, Arterial 225 (*)    Bicarbonate 13.6 (*)    Acid-base deficit 13.2 (*)    Allens test (pass/fail) POSITIVE (*)    All other components within normal limits  GLUCOSE, CAPILLARY     ____________________________________________   EKG ED ECG REPORT I, Tajique N Jaskirat Zertuche, the attending physician, personally viewed and interpreted this ECG.   Date: 11/19/2015  EKG Time: 2:50 AM  Rate:   Rhythm: Complete heart block  Axis: none  Intervals: Abnormal  ST&T Change: None   ____________________________________________    RADIOLOGY     DG Abd 1 View (In process)       DG Chest Port 1 View (Final result) Result time: 11/19/15 03:36:08   Final result by Rad Results In Interface (11/19/15 03:36:08)   Narrative:   CLINICAL DATA: Cardiac arrest. Intubation.  EXAM: PORTABLE CHEST 1 VIEW  COMPARISON: None.  FINDINGS: Endotracheal tube with tip measuring 3.3 cm above the carina. Shallow inspiration. Cardiac enlargement without vascular congestion. There is a focal nodule in the right upper lung measuring 1.9 cm diameter. There is additional nodular consolidation or confluent nodules demonstrated in the right perihilar and infrahilar region. Changes may represent pneumonia although neoplasm should be excluded. Consider CT chest for further evaluation. No blunting of costophrenic angles. No pneumothorax. Visualized bones appear grossly intact.  IMPRESSION: Cardiac enlargement. Nodular opacities in  the right lung. CT suggested for further evaluation.   Electronically Signed By: Burman Nieves M.D. On: 11/19/2015 03:36    Procedure note:INTUBATION Performed by: Darci Current  Required items: required blood products, implants, devices, and special equipment available Patient identity confirmed: provided demographic data and hospital-assigned identification number Time out: Immediately prior to procedure a "time out" was called to verify the correct patient, procedure, equipment, support staff and site/side marked as required.  Indications: Cardiac arrest respiratory failure Intubation method: DL   Preoxygenation: BVM  Sedatives: 20 mg Etomidate Paralytic: 10 mg rocuronium   Tube Size: 7.5cuffed  Post-procedure assessment: chest rise and ETCO2 monitor Breath sounds: equal and absent over the epigastrium Tube secured with: ETT holder Chest x-ray interpreted by radiologist and me.  Chest x-ray findings: endotracheal tube in appropriate position  Patient tolerated the procedure well with no immediate complications.    Critical Care performed: CRITICAL CARE Performed by: Darci Current   Total critical care time: 60 minutes  Critical care time was  exclusive of separately billable procedures and treating other patients.  Critical care was necessary to treat or prevent imminent or life-threatening deterioration.  Critical care was time spent personally by me on the following activities: development of treatment plan with patient and/or surrogate as well as nursing, discussions with consultants, evaluation of patient's response to treatment, examination of patient, obtaining history from patient or surrogate, ordering and performing treatments and interventions, ordering and review of laboratory studies, ordering and review of radiographic studies, pulse oximetry and re-evaluation of patient's  condition.   ____________________________________________   INITIAL IMPRESSION / ASSESSMENT AND PLAN / ED COURSE  Pertinent labs & imaging results that were available during my care of the patient were reviewed by me and considered in my medical decision making (see chart for details).  Given presumed diagnosis of hyperkalemia the patient received 2 A of sodium bicarbonate 1 amp of calcium chloride insulin 8 units D50 half an amp shortly after arrival to the emergency department  Patient discussed with cardiologist on call as well as Dr. Cherylann Ratel nephrologist on call and Dr. Sheryle Hail hospitalist on call for hospital admission for emergent dialysis and further management  ____________________________________________   FINAL CLINICAL IMPRESSION(S) / ED DIAGNOSES  Final diagnoses:  Cardiac arrest (HCC)  Hyperkalemia  Complete heart block (HCC)  Acute pulmonary edema (HCC)      Darci Current, MD 11/19/15 [phone]

## 2015-11-19 NOTE — Progress Notes (Signed)
[facility]. Family and ICU updated.

## 2015-11-19 NOTE — Consult Note (Addendum)
[facility] [place] Critical Care Medicine Consultation     ASSESSMENT/PLAN    This is a 45 year old male with end-stage renal disease on hemodialysis, the patient reportedly missed session of dialysis, he was found down, EMS found the patient in asystole. He underwent CPR 10 minutes. Currently intubated on the ventilator.  NEUROLOGIC A:  Anoxic and/or metabolic encephalopathy. Status post cardiac arrest with CPR for approximately 10 minutes, unknown downtime. P:   The patient was noted to be having clenching of teeth with biting of his tongue, he was given a dose of rocuronium 50 mg 1 and a bite block was placed. Will monitor mental status, hold sedation. -We'll check EEG to rule out presence of seizures.  PULMONARY A:Acute respiratory failure secondary to cardiac arrest. Current vent settings PRVC/500/16/50%/PEEP 5. P:   Continue vent support, vent bundle. -No plans for weaning due to continued encephalopathy.  CARDIOVASCULAR Cardiac arrest of uncertain etiology, unknown down time. -Hypotension, likely secondary to above. Does not appear to have septic etiology at this time, cardiac arrest may have been from acidosis. Right femoral line placed 11/19/2015.  P:  Continue dopamine, wean down as tolerated. Continue IV fluid as tolerated.  RENAL A:  End-stage renal disease on hemodialysis. -Severe metabolic acidosis, suspect secondary to noncompliance with dialysis. P:   Continue hemodialysis per nephro.  GASTROINTESTINAL A:   -Gi Prophylaxis.  -Abd xray reviewed; feeding tube not visualized, will replace.   HEMATOLOGIC A:  Leukocytosis, likely stress, will monitor.   INFECTIOUS -- MRSA PCR negative.  ENDOCRINE A:  Diabetes mellitus. Noncompliance with insulin therapy. - Sliding scale insulin.    ---------------------------------------  ---------------------------------------   Name: Spencer Lopez MRN: [id number] DOB: 01/04/1971    ADMISSION DATE:   11/19/2015 CONSULTATION DATE:  11/19/2015  REFERRING MD :  Dr. Sheryle Hailiamond.   CHIEF COMPLAINt: Unresponsive   HISTORY OF PRESENT ILLNESS:    history and review of systems cannot be obtained from the patient is currently unresponsive.   on string was obtained from the chart and from staff. The patient is a 45 year old male with end-stage renal disease and diabetes mellitus. He apparently missed his last dialysis session, he has also been refusing to take insulin.   EMS was called, patient was found unresponsive in asystole. CPR was initiated, his downtime was known, the patient was) delayed for approximately 10 minutes. Temp upon arrival the ER was 30C. He was therefore not a candidate for hypothermia protocol.   Review of imaging shows no acute abnormality. ABG cw metabolic acidosis. The patient is currently on dopamine. Since his arrival he has been unresponsive, but has an occasional startle like response when touched. He is on no sedation currently and is unresponsive otherwise. He is currently on HD. AG on chem panel is 23.   PAST MEDICAL HISTORY :  Past Medical History  Diagnosis Date  . Diabetes mellitus without complication (HCC)   . Hypertension   . Renal disorder    History reviewed. No pertinent past surgical history. Prior to Admission medications   Not on File   No Known Allergies  FAMILY HISTORY:  History reviewed. No pertinent family history. SOCIAL HISTORY:  has no tobacco, alcohol, and drug history on file.  REVIEW OF SYSTEMS:   Could not be obtained as the patient is currently intubated on the ventilator.   VITAL SIGNS: Temp:  [91.9 F (33.3 C)-99.3 F (37.4 C)] 99.3 F (37.4 C) (03/23 0845) Pulse Rate:  [27-102] 82 (03/23 0845) Resp:  [7-32] 16 (03/23  0845) BP: (90-137)/(51-81) 98/59 mmHg (03/23 0845) SpO2:  [90 %-100 %] 100 % (03/23 0845) FiO2 (%):  [50 %-100 %] 50 % (03/23 0732) Weight:  [175 lb 7.8 oz (79.6 kg)-180 lb 12.4 oz (82 kg)] 180 lb 12.4 oz (82  kg) (03/23 0520) HEMODYNAMICS:   VENTILATOR SETTINGS: Vent Mode:  [-] PRVC FiO2 (%):  [50 %-100 %] 50 % Set Rate:  [16 bmp-18 bmp] 16 bmp Vt Set:  [500 mL-550 mL] 500 mL PEEP:  [5 cmH20] 5 cmH20 INTAKE / OUTPUT:  Intake/Output Summary (Last 24 hours) at 11/19/15 0902 Last data filed at 11/19/15 0700  Gross per 24 hour  Intake  130.5 ml  Output    100 ml  Net   30.5 ml    Physical Examination:   VS: BP 98/59 mmHg  Pulse 82  Temp(Src) 99.3 F (37.4 C)  Resp 16  Ht  (1.727 m)  Wt 180 lb 12.4 oz (82 kg)  BMI 27.49 kg/m2  SpO2 100%  General Appearance: No distress  Neuro:without focal findings, mental status, Unresponsive , occasional gasp and startle response when stimulated physically. HEENT: Pupils fixed and midpoint. Pulmonary: normal breath sounds., diaphragmatic excursion normal.No wheezing, No rales;     CardiovascularNormal S1,S2.  No m/r/g.    Abdomen: Benign, Soft, non-tender, No masses, hepatosplenomegaly, No lymphadenopathy Renal:  No costovertebral tenderness  GU:  Not performed at this time. Endoc: No evident thyromegaly, no signs of acromegaly. Skin:   warm, no rashes, no ecchymosis  Extremities: normal, no cyanosis, clubbing, no edema, warm with normal capillary refill.    LABS: Reviewed   LABORATORY PANEL:   CBC  Recent Labs Lab 11/19/15 0250  WBC 18.4*  HGB 12.1*  HCT 38.7*  PLT 257    Chemistries   Recent Labs Lab 11/19/15 0250  NA 128*  K >7.5*  CL 92*  CO2 13*  GLUCOSE 188*  BUN 113*  CREATININE 15.52*  CALCIUM 9.0     Recent Labs Lab 11/19/15 0307  GLUCAP 99    Recent Labs Lab 11/19/15 0301  PHART 7.21*  PCO2ART 34  PO2ART 225*   No results for input(s): AST, ALT, ALKPHOS, BILITOT, ALBUMIN in the last 168 hours.  Cardiac Enzymes  Recent Labs Lab 11/19/15 0250  TROPONINI 0.17*    RADIOLOGY:  Dg Abd 1 View  11/19/2015  CLINICAL DATA:  NG tube placement.  Patient was found unresponsive. EXAM:  ABDOMEN - 1 VIEW COMPARISON:  None. FINDINGS: Gaseous distention of the stomach. Gaseous distention of the upper abdominal small bowel. Changes likely to represent obstruction. Fold thickening of the small bowel could also indicate enteritis. Enteric tube is not visualized within the field of view of this study. IMPRESSION: Abnormal bowel gas pattern with distended stomach and distended small bowel with small bowel fold thickening. Changes could represent obstruction and/or enteritis. Enteric tube is not visualized within the field of view. Electronically Signed   By: Burman Nieves M.D.   On: 11/19/2015 04:37   Dg Chest Port 1 View  11/19/2015  CLINICAL DATA:  Cardiac arrest.  Intubation. EXAM: PORTABLE CHEST 1 VIEW COMPARISON:  None. FINDINGS: Endotracheal tube with tip measuring 3.3 cm above the carina. Shallow inspiration. Cardiac enlargement without vascular congestion. There is a focal nodule in the right upper lung measuring 1.9 cm diameter. There is additional nodular consolidation or confluent nodules demonstrated in the right perihilar and infrahilar region. Changes may represent pneumonia although neoplasm should be excluded. Consider CT chest  for further evaluation. No blunting of costophrenic angles. No pneumothorax. Visualized bones appear grossly intact. IMPRESSION: Cardiac enlargement. Nodular opacities in the right lung. CT suggested for further evaluation. Electronically Signed   By: Burman Nieves M.D.   On: 11/19/2015 03:36       --Wells Guiles, MD.  Board Certified in Internal Medicine, Pulmonary Medicine, Critical Care Medicine, and Sleep Medicine.  [facility]   Santiago Glad, M.D.  Stephanie Acre, M.D.  Billy Fischer, M.D   11/19/2015, 9:02 AM  Critical Care Attestation.  I have personally obtained a history, examined the patient, evaluated laboratory and imaging results, formulated the assessment and plan and placed orders. The Patient  requires high complexity decision making for assessment and support, frequent evaluation and titration of therapies, application of advanced monitoring technologies and extensive interpretation of multiple databases. The patient has critical illness that could lead imminently to failure of 1 or more organ systems and requires the highest level of physician preparedness to intervene.  Critical Care Time devoted to patient care services described in this note is 35 minutes and is exclusive of time spent in procedures.

## 2015-11-19 NOTE — Progress Notes (Signed)
Dr Rama walked into patients room. We already have given Vit K and started our first bag of Platelets. Patient continues to bleed, have placed liter NS bags for pressure to right groin er Dr Darnelle Catalanama. Patient continues to be unresponsive. He is off sedation and propofol this am. Called Neurologist per family request due to the patients jerking and mental status. Per Neurologist we need CT of head, EEG at this point no continuous EEG monitoring . Dr Darnelle Catalanama also notified of patients fibrgin degradation level.

## 2015-11-20 ENCOUNTER — Encounter (HOSPITAL_COMMUNITY): Payer: Self-pay | Admitting: Pulmonary Disease

## 2015-11-20 ENCOUNTER — Inpatient Hospital Stay (HOSPITAL_COMMUNITY): Payer: Self-pay

## 2015-11-20 ENCOUNTER — Other Ambulatory Visit (HOSPITAL_COMMUNITY): Payer: Non-veteran care

## 2015-11-20 ENCOUNTER — Inpatient Hospital Stay (HOSPITAL_COMMUNITY)
Admission: RE | Admit: 2015-11-20 | Discharge: 2015-11-28 | DRG: 640 | Disposition: E | Payer: Self-pay | Source: Other Acute Inpatient Hospital | Attending: Pulmonary Disease | Admitting: Pulmonary Disease

## 2015-11-20 DIAGNOSIS — Y832 Surgical operation with anastomosis, bypass or graft as the cause of abnormal reaction of the patient, or of later complication, without mention of misadventure at the time of the procedure: Secondary | ICD-10-CM | POA: Diagnosis not present

## 2015-11-20 DIAGNOSIS — G40901 Epilepsy, unspecified, not intractable, with status epilepticus: Secondary | ICD-10-CM | POA: Diagnosis not present

## 2015-11-20 DIAGNOSIS — I469 Cardiac arrest, cause unspecified: Secondary | ICD-10-CM

## 2015-11-20 DIAGNOSIS — J9601 Acute respiratory failure with hypoxia: Secondary | ICD-10-CM | POA: Diagnosis present

## 2015-11-20 DIAGNOSIS — N186 End stage renal disease: Secondary | ICD-10-CM | POA: Diagnosis present

## 2015-11-20 DIAGNOSIS — E1122 Type 2 diabetes mellitus with diabetic chronic kidney disease: Secondary | ICD-10-CM | POA: Diagnosis present

## 2015-11-20 DIAGNOSIS — Z9115 Patient's noncompliance with renal dialysis: Secondary | ICD-10-CM

## 2015-11-20 DIAGNOSIS — E875 Hyperkalemia: Principal | ICD-10-CM | POA: Diagnosis present

## 2015-11-20 DIAGNOSIS — R570 Cardiogenic shock: Secondary | ICD-10-CM | POA: Diagnosis present

## 2015-11-20 DIAGNOSIS — Z79899 Other long term (current) drug therapy: Secondary | ICD-10-CM

## 2015-11-20 DIAGNOSIS — G9340 Encephalopathy, unspecified: Secondary | ICD-10-CM

## 2015-11-20 DIAGNOSIS — R4020 Unspecified coma: Secondary | ICD-10-CM | POA: Diagnosis present

## 2015-11-20 DIAGNOSIS — Z992 Dependence on renal dialysis: Secondary | ICD-10-CM

## 2015-11-20 DIAGNOSIS — D649 Anemia, unspecified: Secondary | ICD-10-CM | POA: Diagnosis present

## 2015-11-20 DIAGNOSIS — T82838A Hemorrhage of vascular prosthetic devices, implants and grafts, initial encounter: Secondary | ICD-10-CM | POA: Diagnosis not present

## 2015-11-20 DIAGNOSIS — I120 Hypertensive chronic kidney disease with stage 5 chronic kidney disease or end stage renal disease: Secondary | ICD-10-CM | POA: Diagnosis present

## 2015-11-20 DIAGNOSIS — G931 Anoxic brain damage, not elsewhere classified: Secondary | ICD-10-CM | POA: Diagnosis present

## 2015-11-20 DIAGNOSIS — Z794 Long term (current) use of insulin: Secondary | ICD-10-CM

## 2015-11-20 DIAGNOSIS — J9600 Acute respiratory failure, unspecified whether with hypoxia or hypercapnia: Secondary | ICD-10-CM

## 2015-11-20 DIAGNOSIS — R001 Bradycardia, unspecified: Secondary | ICD-10-CM | POA: Diagnosis not present

## 2015-11-20 DIAGNOSIS — K7200 Acute and subacute hepatic failure without coma: Secondary | ICD-10-CM | POA: Diagnosis present

## 2015-11-20 DIAGNOSIS — E871 Hypo-osmolality and hyponatremia: Secondary | ICD-10-CM | POA: Diagnosis present

## 2015-11-20 DIAGNOSIS — Z8674 Personal history of sudden cardiac arrest: Secondary | ICD-10-CM

## 2015-11-20 HISTORY — DX: Type 2 diabetes mellitus without complications: E11.9

## 2015-11-20 HISTORY — DX: Cardiac arrest, cause unspecified: I46.9

## 2015-11-20 LAB — CBC
HEMATOCRIT: 26.1 % — AB (ref 39.0–52.0)
HEMOGLOBIN: 8.4 g/dL — AB (ref 13.0–17.0)
MCH: 26.2 pg (ref 26.0–34.0)
MCHC: 32.2 g/dL (ref 30.0–36.0)
MCV: 81.3 fL (ref 78.0–100.0)
Platelets: 184 10*3/uL (ref 150–400)
RBC: 3.21 MIL/uL — ABNORMAL LOW (ref 4.22–5.81)
RDW: 15.8 % — AB (ref 11.5–15.5)
WBC: 14.4 10*3/uL — AB (ref 4.0–10.5)

## 2015-11-20 LAB — PARATHYROID HORMONE, INTACT (NO CA): PTH: 181 pg/mL — AB (ref 15–65)

## 2015-11-20 LAB — COMPREHENSIVE METABOLIC PANEL
ALT: 121 U/L — AB (ref 17–63)
AST: 99 U/L — AB (ref 15–41)
Albumin: 2.6 g/dL — ABNORMAL LOW (ref 3.5–5.0)
Alkaline Phosphatase: 148 U/L — ABNORMAL HIGH (ref 38–126)
Anion gap: 15 (ref 5–15)
BILIRUBIN TOTAL: 1.1 mg/dL (ref 0.3–1.2)
BUN: 53 mg/dL — AB (ref 6–20)
CO2: 28 mmol/L (ref 22–32)
CREATININE: 8.13 mg/dL — AB (ref 0.61–1.24)
Calcium: 7.6 mg/dL — ABNORMAL LOW (ref 8.9–10.3)
Chloride: 99 mmol/L — ABNORMAL LOW (ref 101–111)
GFR calc Af Amer: 8 mL/min — ABNORMAL LOW (ref >60)
GFR, EST NON AFRICAN AMERICAN: 7 mL/min — AB (ref >60)
Glucose, Bld: 131 mg/dL — ABNORMAL HIGH (ref 65–99)
POTASSIUM: 4.9 mmol/L (ref 3.5–5.1)
Sodium: 142 mmol/L (ref 135–145)
TOTAL PROTEIN: 5.3 g/dL — AB (ref 6.5–8.1)

## 2015-11-20 LAB — BASIC METABOLIC PANEL
ANION GAP: 16 — AB (ref 5–15)
BUN: 65 mg/dL — AB (ref 6–20)
CHLORIDE: 97 mmol/L — AB (ref 101–111)
CO2: 24 mmol/L (ref 22–32)
Calcium: 7.3 mg/dL — ABNORMAL LOW (ref 8.9–10.3)
Creatinine, Ser: 9.21 mg/dL — ABNORMAL HIGH (ref 0.61–1.24)
GFR calc Af Amer: 7 mL/min — ABNORMAL LOW (ref >60)
GFR calc non Af Amer: 6 mL/min — ABNORMAL LOW (ref >60)
GLUCOSE: 155 mg/dL — AB (ref 65–99)
POTASSIUM: 5 mmol/L (ref 3.5–5.1)
Sodium: 137 mmol/L (ref 135–145)

## 2015-11-20 LAB — BLOOD GAS, ARTERIAL
ACID-BASE EXCESS: 5 mmol/L — AB (ref 0.0–2.0)
Bicarbonate: 28.5 mEq/L — ABNORMAL HIGH (ref 20.0–24.0)
Drawn by: 10006
FIO2: 0.3
LHR: 16 {breaths}/min
MECHVT: 550 mL
O2 Saturation: 98.3 %
PEEP/CPAP: 5 cmH2O
Patient temperature: 99.8
TCO2: 29.7 mmol/L (ref 0–100)
pCO2 arterial: 40.1 mmHg (ref 35.0–45.0)
pH, Arterial: 7.469 — ABNORMAL HIGH (ref 7.350–7.450)
pO2, Arterial: 113 mmHg — ABNORMAL HIGH (ref 80.0–100.0)

## 2015-11-20 LAB — DIC (DISSEMINATED INTRAVASCULAR COAGULATION) PANEL
APTT: 37 s (ref 24–37)
INR: 1.21 (ref 0.00–1.49)
PLATELETS: 185 10*3/uL (ref 150–400)
PROTHROMBIN TIME: 15.4 s — AB (ref 11.6–15.2)

## 2015-11-20 LAB — LACTIC ACID, PLASMA: Lactic Acid, Venous: 1.8 mmol/L (ref 0.5–2.0)

## 2015-11-20 LAB — HEPATITIS B SURFACE ANTIGEN: Hepatitis B Surface Ag: NEGATIVE

## 2015-11-20 LAB — TROPONIN I
TROPONIN I: 0.5 ng/mL — AB (ref <0.031)
TROPONIN I: 0.39 ng/mL — AB (ref <0.031)

## 2015-11-20 LAB — CBC WITH DIFFERENTIAL/PLATELET
BASOS ABS: 0.2 10*3/uL — AB (ref 0.0–0.1)
BASOS PCT: 1 %
EOS ABS: 0.3 10*3/uL (ref 0.0–0.7)
EOS PCT: 2 %
HCT: 19.8 % — ABNORMAL LOW (ref 39.0–52.0)
Hemoglobin: 6.4 g/dL — CL (ref 13.0–17.0)
Lymphocytes Relative: 12 %
Lymphs Abs: 1.7 10*3/uL (ref 0.7–4.0)
MCH: 26.1 pg (ref 26.0–34.0)
MCHC: 32.3 g/dL (ref 30.0–36.0)
MCV: 80.8 fL (ref 78.0–100.0)
Monocytes Absolute: 2.4 10*3/uL — ABNORMAL HIGH (ref 0.1–1.0)
Monocytes Relative: 17 %
Neutro Abs: 9.7 10*3/uL — ABNORMAL HIGH (ref 1.7–7.7)
Neutrophils Relative %: 68 %
PLATELETS: 195 10*3/uL (ref 150–400)
RBC: 2.45 MIL/uL — AB (ref 4.22–5.81)
RDW: 14.3 % (ref 11.5–15.5)
WBC: 14.2 10*3/uL — AB (ref 4.0–10.5)

## 2015-11-20 LAB — DIC (DISSEMINATED INTRAVASCULAR COAGULATION)PANEL
D-Dimer, Quant: 5.61 ug/mL-FEU — ABNORMAL HIGH (ref 0.00–0.50)
Fibrinogen: 479 mg/dL — ABNORMAL HIGH (ref 204–475)
Smear Review: NONE SEEN

## 2015-11-20 LAB — GLUCOSE, CAPILLARY
GLUCOSE-CAPILLARY: 115 mg/dL — AB (ref 65–99)
GLUCOSE-CAPILLARY: 109 mg/dL — AB (ref 65–99)
GLUCOSE-CAPILLARY: 143 mg/dL — AB (ref 65–99)
Glucose-Capillary: 130 mg/dL — ABNORMAL HIGH (ref 65–99)
Glucose-Capillary: 142 mg/dL — ABNORMAL HIGH (ref 65–99)
Glucose-Capillary: 144 mg/dL — ABNORMAL HIGH (ref 65–99)
Glucose-Capillary: 69 mg/dL (ref 65–99)
Glucose-Capillary: 97 mg/dL (ref 65–99)

## 2015-11-20 LAB — ECHOCARDIOGRAM COMPLETE: WEIGHTICAEL: 2825.42 [oz_av]

## 2015-11-20 LAB — TRIGLYCERIDES: TRIGLYCERIDES: 186 mg/dL — AB (ref <150)

## 2015-11-20 LAB — HEPATITIS B SURFACE ANTIBODY, QUANTITATIVE: Hep B S AB Quant (Post): >1000 m[IU]/mL

## 2015-11-20 LAB — PHOSPHORUS
Phosphorus: 7.1 mg/dL — ABNORMAL HIGH (ref 2.5–4.6)
Phosphorus: 7.7 mg/dL — ABNORMAL HIGH (ref 2.5–4.6)

## 2015-11-20 LAB — MAGNESIUM
MAGNESIUM: 2.2 mg/dL (ref 1.7–2.4)
Magnesium: 2.4 mg/dL (ref 1.7–2.4)

## 2015-11-20 LAB — BRAIN NATRIURETIC PEPTIDE: B NATRIURETIC PEPTIDE 5: 940.6 pg/mL — AB (ref 0.0–100.0)

## 2015-11-20 LAB — PREPARE RBC (CROSSMATCH)

## 2015-11-20 LAB — ABO/RH: ABO/RH(D): AB POS

## 2015-11-20 LAB — PROCALCITONIN: Procalcitonin: 24.84 ng/mL

## 2015-11-20 MED ORDER — CHLORHEXIDINE GLUCONATE 0.12% ORAL RINSE (MEDLINE KIT)
15.0000 mL | Freq: Two times a day (BID) | OROMUCOSAL | Status: DC
  Administered 2015-11-20 – 2015-11-23 (×7): 15 mL via OROMUCOSAL

## 2015-11-20 MED ORDER — PROPOFOL 1000 MG/100ML IV EMUL
0.0000 ug/kg/min | INTRAVENOUS | Status: DC
  Administered 2015-11-20 (×2): 35 ug/kg/min via INTRAVENOUS
  Administered 2015-11-20: 5 ug/kg/min via INTRAVENOUS
  Administered 2015-11-20: 50 ug/kg/min via INTRAVENOUS
  Administered 2015-11-20: 35 ug/kg/min via INTRAVENOUS
  Administered 2015-11-21 (×2): 75 ug/kg/min via INTRAVENOUS
  Administered 2015-11-21: 60 ug/kg/min via INTRAVENOUS
  Administered 2015-11-21 – 2015-11-22 (×8): 75 ug/kg/min via INTRAVENOUS
  Administered 2015-11-22: 73.171 ug/kg/min via INTRAVENOUS
  Administered 2015-11-22 (×3): 75 ug/kg/min via INTRAVENOUS
  Administered 2015-11-23: 70 ug/kg/min via INTRAVENOUS
  Filled 2015-11-20 (×16): qty 100
  Filled 2015-11-20: qty 200
  Filled 2015-11-20 (×4): qty 100

## 2015-11-20 MED ORDER — DEXTROSE 50 % IV SOLN
25.0000 mL | Freq: Once | INTRAVENOUS | Status: AC
  Administered 2015-11-20: 25 mL via INTRAVENOUS

## 2015-11-20 MED ORDER — ACETAMINOPHEN 325 MG PO TABS: 650.0000 mg | ORAL_TABLET | ORAL | Status: DC | PRN

## 2015-11-20 MED ORDER — LEVETIRACETAM 500 MG/5ML IV SOLN
500.0000 mg | INTRAVENOUS | Status: AC
  Administered 2015-11-20: 500 mg via INTRAVENOUS
  Filled 2015-11-20: qty 5

## 2015-11-20 MED ORDER — SODIUM CHLORIDE 0.9 % IV SOLN
1000.0000 mg | Freq: Two times a day (BID) | INTRAVENOUS | Status: DC
  Administered 2015-11-20: 1000 mg via INTRAVENOUS
  Filled 2015-11-20 (×2): qty 10

## 2015-11-20 MED ORDER — PANTOPRAZOLE SODIUM 40 MG IV SOLR
40.0000 mg | Freq: Every day | INTRAVENOUS | Status: DC
  Administered 2015-11-20 – 2015-11-22 (×3): 40 mg via INTRAVENOUS
  Filled 2015-11-20 (×3): qty 40

## 2015-11-20 MED ORDER — DEXTROSE 50 % IV SOLN
INTRAVENOUS | Status: AC
  Filled 2015-11-20: qty 50

## 2015-11-20 MED ORDER — INSULIN ASPART 100 UNIT/ML ~~LOC~~ SOLN
2.0000 [IU] | SUBCUTANEOUS | Status: DC
  Administered 2015-11-20 – 2015-11-21 (×6): 2 [IU] via SUBCUTANEOUS
  Administered 2015-11-22: 4 [IU] via SUBCUTANEOUS
  Administered 2015-11-22 – 2015-11-23 (×2): 2 [IU] via SUBCUTANEOUS
  Administered 2015-11-23: 4 [IU] via SUBCUTANEOUS

## 2015-11-20 MED ORDER — SODIUM CHLORIDE 0.9 % IV SOLN
500.0000 mg | Freq: Two times a day (BID) | INTRAVENOUS | Status: DC
  Filled 2015-11-20: qty 5

## 2015-11-20 MED ORDER — SODIUM CHLORIDE 0.9 % IV SOLN
1500.0000 mg | Freq: Two times a day (BID) | INTRAVENOUS | Status: DC
Start: 2015-11-20 — End: 2015-11-23
  Administered 2015-11-20 – 2015-11-23 (×6): 1500 mg via INTRAVENOUS
  Filled 2015-11-20 (×7): qty 15

## 2015-11-20 MED ORDER — ANTISEPTIC ORAL RINSE SOLUTION (CORINZ)
7.0000 mL | OROMUCOSAL | Status: DC
  Administered 2015-11-20 – 2015-11-23 (×38): 7 mL via OROMUCOSAL

## 2015-11-20 MED ORDER — VALPROATE SODIUM 500 MG/5ML IV SOLN
15.0000 mg/kg/d | Freq: Three times a day (TID) | INTRAVENOUS | Status: DC
  Administered 2015-11-20 – 2015-11-21 (×3): 401 mg via INTRAVENOUS
  Filled 2015-11-20 (×5): qty 4.01

## 2015-11-20 MED ORDER — PIPERACILLIN-TAZOBACTAM IN DEX 2-0.25 GM/50ML IV SOLN
2.2500 g | Freq: Three times a day (TID) | INTRAVENOUS | Status: DC
  Administered 2015-11-20 – 2015-11-23 (×11): 2.25 g via INTRAVENOUS
  Filled 2015-11-20 (×14): qty 50

## 2015-11-20 MED ORDER — SODIUM CHLORIDE 0.9 % IV SOLN: 250.0000 mL | INTRAVENOUS | Status: DC | PRN

## 2015-11-20 MED ORDER — VALPROATE SODIUM 500 MG/5ML IV SOLN
1600.0000 mg | Freq: Once | INTRAVENOUS | Status: AC
  Administered 2015-11-20: 1600 mg via INTRAVENOUS
  Filled 2015-11-20: qty 16

## 2015-11-20 MED ORDER — SODIUM CHLORIDE 0.9 % IV SOLN: Freq: Once | INTRAVENOUS | Status: DC

## 2015-11-20 NOTE — Progress Notes (Signed)
CRITICAL VALUE ALERT  Critical value received:  Hgb 6.7   Date of notification:  3/24  Time of notification:  1:58 AM   Critical value read back:Yes.    Nurse who received alert:  Ahmed PrimaLinda Katy Brickell, RN  MD notified (1st page):  .Ramaswami/Hoffman   Time of first page:  1:59 AM   MD notified (2nd page):  Time of second page:  Responding MD:  Mikey BussingHoffman  Time MD responded:  1:59 AM '

## 2015-11-20 NOTE — Progress Notes (Signed)
Initial Nutrition Assessment  DOCUMENTATION CODES:   Not applicable  INTERVENTION:   - If unable to extubate, recommend verifying placement of OG tube and initiating enteral nutrition with Nepro at a goal rate of 30 ml/hr and providing 30 ml Prostat QID.  Tube feeding regimen and propofol at current rate would provide a total of 2134 kcals, 118 grams of protein, and 602 ml of water.   NUTRITION DIAGNOSIS:   Inadequate oral intake related to inability to eat as evidenced by NPO status.  GOAL:   Patient will meet greater than or equal to 90% of their needs  MONITOR:   Vent status, Labs, Weight trends, I & O's, Skin  REASON FOR ASSESSMENT:   Ventilator, Malnutrition Screening Tool    ASSESSMENT:   45 y.o. male presenting with cardiac arrest.  PMH of ESRD on HD. Pt reportedly missed a dialysis treatment and was found down. [facility], received emergent dialysis on 3/23, pt with seizure like activity.  Pt with bleeding from IV site, oral cavity, and foley insertion site. [facility] for continuous EEG, status epilepticus following cardiac arrest.     Spoke to patients family who report a poor appetite since last Sunday (3/19).  States that prior to that Sunday he was eating ok.  Pts family state the that patient looks like he has lost a little weight, unable to verify per chart review.   Patient currently intubated on ventilator support. MV: 8.6 ml/min Temp (24hrs), Avg:99.8 F (37.7 C), Min:98.6 F (37 C), Max:101.3 F (38.5 C) OG tube, not visualized on abdominal x-ray, verify placement Propofol: 16.6 ml/hr, providing 438 kcals from lipid.  Nutrition Focused Physical Exam was completed.  Findings include no fat depletion, no muscle depletion, and no edema.    Medications reviewed: novolog.  Labs reviewed: elevated phosphorus (7.1), CBGs (109-130).   Diet Order:  Diet NPO time specified  Skin:  Reviewed, no issues  Last BM:   3/24  Height:   Ht Readings from Last 1 Encounters:  11/19/15 5\' 8"  (1.727 m)    Weight:   Wt Readings from Last 1 Encounters:  11/01/2015 176 lb 9.4 oz (80.1 kg)    Ideal Body Weight:  70 kg  BMI:  Body mass index is 26.86 kg/(m^2).  Estimated Nutritional Needs:   Kcal:  2102  Protein:  >/= 120 grams  Fluid:  per MD  EDUCATION NEEDS:   No education needs identified at this time  Doroteo Glassmanoleman Tysheka Fanguy, Dietetic Intern Pager: [phone]

## 2015-11-20 NOTE — Progress Notes (Signed)
Pharmacy Antibiotic Note  Spencer Lopez is a 45 y.o. male admitted on 11/21/2015 with aspiration s/p cardiac arrest.  Pharmacy has been consulted for Zosyn dosing.  Plan: Zosyn 2.25g IV Q8H.   Temp (24hrs), Avg:98.1 F (36.7 C), Min:91.9 F (33.3 C), Max:101.3 F (38.5 C)   Recent Labs Lab 11/19/15 0250 11/19/15 0903 11/19/15 1227 11/19/15 1741  WBC 18.4* 17.0*  --  13.5*  CREATININE 15.52* 7.19*  --  9.76*  LATICACIDVEN  --   --  1.8  --     Estimated Creatinine Clearance: 9.1 mL/min (by C-G formula based on Cr of 9.76).    No Known Allergies    Thank you for allowing pharmacy to be a part of this patient's care.  Vernard GamblesVeronda Montoya Brandel, PharmD, BCPS  11/18/2015 1:26 AM

## 2015-11-20 NOTE — Progress Notes (Signed)
PULMONARY / CRITICAL CARE MEDICINE   Name: Spencer Lopez MRN: [id number] DOB: 1971-03-15    ADMISSION DATE:  12-11-15 CONSULTATION DATE:  Dec 11, 2015  REFERRING MD:  [facility]  CHIEF COMPLAINT:  Cardiac arrest  HISTORY OF PRESENT ILLNESS: Patient is intubated, no family is present at this time. History is taken from prior notes.  45 year old male with PMH as below, which includes ESRD on HD, DM, HTN. He was initially admitted to [facility] 3/23 AM s/p cardiac arrest. Patient had reportedly missed dialysis session(s). He was found down without pulse by brother who started CPR at scene and called EMS. ROSC was achieved by EMS within about 10 mins of the patient being found down. In ED he was in complete heart block, likely secondary to potassium >7.5. He was also hypoglycemic (20). He was intubated in ED. Dialysis catheter was placed and he was started on HD after temporizing measures (Ca++, insulin). He was not started on induced hypothermia due to temp 30C on admit.  Hosptial course complicated by coagulapathy (improved with Theodoro Parma), Status epilepticus vs myoclonus (cEEG pending), and refractory shock. Patient transferred to ICU for PCCM admission.   SUBJECTIVE:  Low-grade febrile Had seizure-like activity when propofol turned off Mom and dad at bedside  VITAL SIGNS: BP 126/58 mmHg  Pulse 77  Temp(Src) 100 F (37.8 C) (Axillary)  Resp 19  Wt 176 lb 9.4 oz (80.1 kg)  SpO2 100%  HEMODYNAMICS:    VENTILATOR SETTINGS: Vent Mode:  [-] PRVC FiO2 (%):  [30 %-50 %] 30 % Set Rate:  [16 bmp] 16 bmp Vt Set:  [500 mL-550 mL] 550 mL PEEP:  [5 cmH20] 5 cmH20 Plateau Pressure:  [14 cmH20-16 cmH20] 16 cmH20  INTAKE / OUTPUT: I/O last 3 completed shifts: In: 172.3 [I.V.:12.3; IV Piggyback:160] Out: 250 [Emesis/NG output:250]  PHYSICAL EXAMINATION: General:  Male of normal body habitus in NAD Neuro:  Comatose on vent with cEEG, corneals +, Pupils fixed. NRTL HEENT:  Epistaxis, otherwise  [place]/AT. No JVD Cardiovascular:  RRR no MRG, RUE graft +thrill Lungs:  Clear bilateral breath sounds Abdomen:  Soft, non-tender, non-distended Musculoskeletal:  No acute deformity Skin:  Grossly intact  LABS:  BMET  Recent Labs Lab 11/19/15 1741 2015/12/11 0120 12-11-2015 1230  NA 131* 142 137  K 6.3* 4.9 5.0  CL 92* 99* 97*  CO2 21* 28 24  BUN 69* 53* 65*  CREATININE 9.76* 8.13* 9.21*  GLUCOSE 180* 131* 155*    Electrolytes  Recent Labs Lab 11/19/15 1227 11/19/15 1741 December 11, 2015 0120 2015/12/11 1230  CALCIUM  --  7.4* 7.6* 7.3*  MG  --   --  2.2 2.4  PHOS 8.4*  --  7.1* 7.7*    CBC  Recent Labs Lab 11/19/15 1741 12/11/15 0120 2015-12-11 0121 12-11-15 1230  WBC 13.5* 14.2*  --  14.4*  HGB 7.2* 6.4*  --  8.4*  HCT 22.0* 19.8*  --  26.1*  PLT 173 195 185 184    Coag's  Recent Labs Lab 11/19/15 1741 11/19/15 2056 12/11/2015 0121  APTT  --   --  37  INR 1.69 1.19 1.21    Sepsis Markers  Recent Labs Lab 11/19/15 1227 2015-12-11 0115 Dec 11, 2015 0219  LATICACIDVEN 1.8 1.8  --   PROCALCITON  --   --  24.84    ABG  Recent Labs Lab 11/19/15 0301 2015-12-11 0217  PHART 7.21* 7.469*  PCO2ART 34 40.1  PO2ART 225* 113*    Liver Enzymes  Recent Labs  Lab 11/19/15 0903 05-25-16 0120  AST 381* 99*  ALT 232* 121*  ALKPHOS 278* 148*  BILITOT 1.0 1.1  ALBUMIN 3.0* 2.6*    Cardiac Enzymes  Recent Labs Lab 11/19/15 0250 05-25-16 0120 05-25-16 1230  TROPONINI 0.17* 0.50* 0.39*    Glucose  Recent Labs Lab 05-25-16 0032 05-25-16 0149 05-25-16 0419 05-25-16 0725 05-25-16 1225 05-25-16 1506  GLUCAP 69 115* 109* 130* 143* 142*    Imaging Ct Head Wo Contrast  11/19/2015  CLINICAL DATA:  Post cardiac arrest.  Intubated.  Unresponsive. EXAM: CT HEAD WITHOUT CONTRAST TECHNIQUE: Contiguous axial images were obtained from the base of the skull through the vertex without intravenous contrast. COMPARISON:  None FINDINGS: Ventricle size is normal.  Negative for acute or chronic infarction. Negative for hemorrhage or fluid collection. Negative for mass or edema. No shift of the midline structures. Calvarium is intact. Mucosal edema in the paranasal sinuses.  Air-fluid levels.  NG tube. IMPRESSION: Normal CT of the brain Sinusitis Electronically Signed   By: Marlan Palauharles  Clark M.D.   On: 11/19/2015 18:26   Dg Chest Port 1 View  05/15/16  CLINICAL DATA:  Endotracheal tube post trans port. EXAM: PORTABLE CHEST 1 VIEW COMPARISON:  11/19/2015 FINDINGS: Endotracheal tube tip is low, measuring 1.5 cm above carina. Shallow inspiration with atelectasis or infiltration in the right lung base. No pneumothorax. Mild cardiac enlargement. Calcification of the aorta. Nodular opacity seen previously in the right upper lung are less prominent. IMPRESSION: Endotracheal tube tip measures 1.5 cm above the carina. Shallow inspiration with infiltration or atelectasis in the right lung base. Cardiac enlargement. Electronically Signed   By: Burman NievesWilliam  Stevens M.D.   On: 009/17/17 01:30     STUDIES:  CT head 3/23 > no acute abnormality EEG 3/23 > status epilepticus  Echo 3/24 >EF 40%, diffuse hypo cEEG 3/24 > 2 Hz generalized spike and slow wave activity c/w status  CULTURES:   ANTIBIOTICS: Zosyn 3/23 > suspected aspiration  SIGNIFICANT EVENTS: 3/23 cardiac arrest [facility]  LINES/TUBES: R fem CVL 3/23 >>>  DISCUSSION: 45 year old male with ESRD and DM suffered cardiac arrest 3/23. Unknown downtime. ROSC after 10 mins CPR. CHB and hyperkalemia initially. Underwent HD. Status epilepticus in setting likely anoxic injury. [facility] Off pressors. Prognosis likely poor.  ASSESSMENT / PLAN:  PULMONARY A: Acute respiratory failure secondary to cardiac arrest.  P:  Full vent support   CARDIOVASCULAR A: Cardiac arrest of unknown down time, suspect secondary to hyperkalemia. Shock, likely cardiogenic. Does not appear to have  septic etiology at this time, Right femoral line placed 11/19/2015.  P:  Telemetry monitoring Off pressors, if needs would opt levo   RENAL A:   ESRD on HD Hyperkalemia Hyponatremia Hypocalcemia  P:   [facility] consulted nephrology   GASTROINTESTINAL A:   Nutrition Transaminitis, likely shock liver.  P:   NPO Hold TF for now Trend LFT  HEMATOLOGIC A:   Anemia Coagulopathy on warfarin (initial INR 9, [facility])  P:  Repeat CBC Transfuse per ICU guideline SCDs only for now  INFECTIOUS A:   Likely aspiration  P:   Zosyn PCT  ENDOCRINE A:   DM    P:   CBG, SSI  NEUROLOGIC A:   Acute anoxic encephalopathy Status epilepticus  P:   RASS goal: 0 Neurology following Continuous EEG Continue Keppra Propofol for status  FAMILY  - Updates: Mom  and dad at bedside 3/24, guarded-neuro  prognosis given   - Inter-disciplinary family meet or Palliative Care meeting due by:  3/30   Summary - unfortunately he continues to be in status epilepticus, more than 24 hours after cardiac arrest, no cerebral edema on initial head CT-but certainly this reflects a poor  prognosis for 'meaningful' neurologic recovery   The patient is critically ill with multiple organ systems failure and requires high complexity decision making for assessment and support, frequent evaluation and titration of therapies, application of advanced monitoring technologies and extensive interpretation of multiple databases. Critical Care Time devoted to patient care services described in this note independent of APP time is 35 minutes.   Cyril Mourning MD. Tonny Bollman. [place] [facility] Pager [phone] If no response call [phone]   14-Dec-2015    December 14, 2015 4:00 PM

## 2015-11-20 NOTE — H&P (Signed)
PULMONARY / CRITICAL CARE MEDICINE   Name: Spencer Lopez MRN: [id number] DOB: 03/23/1971    ADMISSION DATE:  11/19/2015 CONSULTATION DATE:  11/14/2015  REFERRING MD:  [facility]  CHIEF COMPLAINT:  Cardiac arrest  HISTORY OF PRESENT ILLNESS: Patient is intubated, no family is present at this time. History is taken from prior notes.  45 year old male with PMH as below, which includes ESRD on HD, DM, HTN. He was initially admitted to [facility] 3/23 AM s/p cardiac arrest. Patient had reportedly missed dialysis session(s). He was found down without pulse by brother who started CPR at scene and called EMS. ROSC was achieved by EMS within about 10 mins of the patient being found down. In ED he was in complete heart block, likely secondary to potassium >7.5. He was also hypoglycemic (20). He was intubated in ED. Dialysis catheter was placed and he was started on HD after temporizing measures (Ca++, insulin). He was not started on induced hypothermia due to temp 30C on admit.  Hosptial course complicated by coagulapathy (improved with Theodoro Parma), Status epilepticus vs myoclonus (cEEG pending), and refractory shock. Patient transferred to ICU for PCCM admission.   PAST MEDICAL HISTORY :  He  has a past medical history of Diabetes mellitus without complication (HCC); Hypertension; and Renal disorder.  PAST SURGICAL HISTORY: He  has no past surgical history on file.  No Known Allergies  No current facility-administered medications on file prior to encounter.   Current Outpatient Prescriptions on File Prior to Encounter  Medication Sig  . DOPamine (INTROPIN) 3.2-5 MG/ML-% infusion Inject 0-1.64 mg/min into the vein continuous.  . fentaNYL (SUBLIMAZE) 100 MCG/2ML injection Inject 2 mLs (100 mcg total) into the vein every 15 (fifteen) minutes as needed (to achieve RASS goal.).  Marland Kitchen insulin aspart (NOVOLOG) 100 UNIT/ML injection Inject 0-15 Units into the skin every 4 (four) hours.  . levETIRAcetam 500 mg in  sodium chloride 0.9 % 100 mL Inject 500 mg into the vein daily.  Marland Kitchen LORazepam (ATIVAN) 2 MG/ML injection Inject 1-2 mLs (2-4 mg total) into the vein every 10 (ten) minutes as needed (seizures).  . pantoprazole (PROTONIX) 40 MG injection Inject 40 mg into the vein every 12 (twelve) hours.  . piperacillin-tazobactam (ZOSYN) 3.375 GM/50ML IVPB Inject 50 mLs (3.375 g total) into the vein every 12 (twelve) hours.  . propofol (DIPRIVAN) 1000 MG/100ML EMUL injection Inject 410-6,560 mcg/min into the vein continuous.    FAMILY HISTORY:  His has no family status information on file.   SOCIAL HISTORY: He    REVIEW OF SYSTEMS:   unable  SUBJECTIVE:    VITAL SIGNS: There were no vitals taken for this visit.  HEMODYNAMICS:    VENTILATOR SETTINGS: Vent Mode:  [-] PRVC FiO2 (%):  [50 %-100 %] 50 % Set Rate:  [16 bmp-18 bmp] 16 bmp Vt Set:  [500 mL-550 mL] 500 mL PEEP:  [5 cmH20] 5 cmH20  INTAKE / OUTPUT:    PHYSICAL EXAMINATION: General:  Male of normal body habitus in NAD Neuro:  Comatose on vent HEENT:  Epistaxis, otherwise [place]/AT. Pupils fixed. No JVD Cardiovascular:  RRR no MRG, RUE graft +thrill Lungs:  Clear bilateral breath sounds Abdomen:  Soft, non-tender, non-distended Musculoskeletal:  No acute deformity Skin:  Grossly intact  LABS:  BMET  Recent Labs Lab 11/19/15 0250 11/19/15 0903 11/19/15 1741  NA 128* 134* 131*  K >7.5* 4.1 6.3*  CL 92* 96* 92*  CO2 13* 21* 21*  BUN 113* 55* 69*  CREATININE  15.52* 7.19* 9.76*  GLUCOSE 188* 115* 180*    Electrolytes  Recent Labs Lab 11/19/15 0250 11/19/15 0903 11/19/15 1227 11/19/15 1741  CALCIUM 9.0 8.0*  --  7.4*  PHOS  --  6.6* 8.4*  --     CBC  Recent Labs Lab 11/19/15 0250 11/19/15 0903 11/19/15 1741  WBC 18.4* 17.0* 13.5*  HGB 12.1* 11.3* 7.2*  HCT 38.7* 33.9* 22.0*  PLT 257 232 173    Coag's  Recent Labs Lab 11/19/15 0250 11/19/15 1741 11/19/15 2056  INR 9.74* 1.69 1.19    Sepsis  Markers  Recent Labs Lab 11/19/15 1227  LATICACIDVEN 1.8    ABG  Recent Labs Lab 11/19/15 0301  PHART 7.21*  PCO2ART 34  PO2ART 225*    Liver Enzymes  Recent Labs Lab 11/19/15 0903  AST 381*  ALT 232*  ALKPHOS 278*  BILITOT 1.0  ALBUMIN 3.0*    Cardiac Enzymes  Recent Labs Lab 11/19/15 0250  TROPONINI 0.17*    Glucose  Recent Labs Lab 11/19/15 0307 11/19/15 1038 11/19/15 1136 11/19/15 1722 11/19/15 1953  GLUCAP 99 125* 117* 153* 156*    Imaging Dg Abd 1 View  11/19/2015  CLINICAL DATA:  NG tube placement.  Patient was found unresponsive. EXAM: ABDOMEN - 1 VIEW COMPARISON:  None. FINDINGS: Gaseous distention of the stomach. Gaseous distention of the upper abdominal small bowel. Changes likely to represent obstruction. Fold thickening of the small bowel could also indicate enteritis. Enteric tube is not visualized within the field of view of this study. IMPRESSION: Abnormal bowel gas pattern with distended stomach and distended small bowel with small bowel fold thickening. Changes could represent obstruction and/or enteritis. Enteric tube is not visualized within the field of view. Electronically Signed   By: Burman Nieves M.D.   On: 11/19/2015 04:37   Ct Head Wo Contrast  11/19/2015  CLINICAL DATA:  Post cardiac arrest.  Intubated.  Unresponsive. EXAM: CT HEAD WITHOUT CONTRAST TECHNIQUE: Contiguous axial images were obtained from the base of the skull through the vertex without intravenous contrast. COMPARISON:  None FINDINGS: Ventricle size is normal. Negative for acute or chronic infarction. Negative for hemorrhage or fluid collection. Negative for mass or edema. No shift of the midline structures. Calvarium is intact. Mucosal edema in the paranasal sinuses.  Air-fluid levels.  NG tube. IMPRESSION: Normal CT of the brain Sinusitis Electronically Signed   By: Marlan Palau M.D.   On: 11/19/2015 18:26   Dg Chest Port 1 View  11/19/2015  CLINICAL DATA:   Cardiac arrest.  Intubation. EXAM: PORTABLE CHEST 1 VIEW COMPARISON:  None. FINDINGS: Endotracheal tube with tip measuring 3.3 cm above the carina. Shallow inspiration. Cardiac enlargement without vascular congestion. There is a focal nodule in the right upper lung measuring 1.9 cm diameter. There is additional nodular consolidation or confluent nodules demonstrated in the right perihilar and infrahilar region. Changes may represent pneumonia although neoplasm should be excluded. Consider CT chest for further evaluation. No blunting of costophrenic angles. No pneumothorax. Visualized bones appear grossly intact. IMPRESSION: Cardiac enlargement. Nodular opacities in the right lung. CT suggested for further evaluation. Electronically Signed   By: Burman Nieves M.D.   On: 11/19/2015 03:36     STUDIES:  CT head 3/23 > no acute abnormality EEG 3/23 > status epilepticus  Echo 3/24 > cEEG 3/24 >   CULTURES:   ANTIBIOTICS: Zosyn 3/23 > suspected aspiration  SIGNIFICANT EVENTS: 3/23 cardiac arrest 3/24 transfer to [facility]  LINES/TUBES: R  fem CVL 3/23 >>>  DISCUSSION: 45 year old male with ESRD and DM suffered cardiac arrest 3/23. Unknown downtime. ROSC after 10 mins CPR. CHB and hyperkalemia initially. Underwent HD. Status epilepticus in setting likely anoxic injury. [facility]. Off pressors. Prognosis likely poor.  ASSESSMENT / PLAN:  PULMONARY A: Acute respiratory failure secondary to cardiac arrest.  P:  Continue vent support Full vent support ABG Vent bundle CXR s/p transfer  CARDIOVASCULAR A: Cardiac arrest of unknown down time, suspect secondary to hyperkalemia. Shock, likely cardiogenic. Does not appear to have septic etiology at this time, Right femoral line placed 11/19/2015.  P:  Telemetry monitoring Off pressors, if needs would opt levo Trend trop, lactic KVO IVF  RENAL A:   ESRD on HD Hyperkalemia Hyponatremia Hypocalcemia  P:   [facility] Will repeat CMET and consult nephrology if further urgent HD needed.  Otherwise will call in AM  GASTROINTESTINAL A:   Nutrition Transaminitis, likely shock liver.  P:   NPO Hold TF for now Trend LFT  HEMATOLOGIC A:   Anemia Coagulopathy on warfarin (initial INR 9, [facility]) ? DIC  P:  Repeat CBC DIC panel Transfuse per ICU guideline SCDs only for now  INFECTIOUS A:   Likely aspiration  P:   Zosyn PCT  ENDOCRINE A:   DM    P:   CBG, SSI  NEUROLOGIC A:   Acute anoxic encephalopathy Status epilepticus  P:   RASS goal: 0 Consult neurology Continuous EEG Continue Keppra Propofol for status  FAMILY  - Updates: no family available at this time   - Inter-disciplinary family meet or Palliative Care meeting due by:  3/30   Joneen RoachPaul Antoin Dargis, AGACNP-BC [place] [facility] Pager [phone] or [phone]  11/27/2015 1:13 AM

## 2015-11-20 NOTE — Progress Notes (Signed)
Subjective: Patient had propofol turned off and patient noted to have rhythmic jerking of right > left arm/neck and face, occurring at about 1 Hz, coinciding with spikes on the EEG. Propofol was resumed and Keppra increased to 1500 BID.    Objective: Current vital signs: BP 96/58 mmHg  Pulse 71  Temp(Src) 100 F (37.8 C) (Axillary)  Resp 11  Wt 80.1 kg (176 lb 9.4 oz)  SpO2 98% Vital signs in last 24 hours: Temp:  [98.8 F (37.1 C)-100.4 F (38 C)] 100 F (37.8 C) (03/24 1500) Pulse Rate:  [71-90] 71 (03/24 1800) Resp:  [10-27] 11 (03/24 1800) BP: (90-165)/(47-122) 96/58 mmHg (03/24 1800) SpO2:  [95 %-100 %] 98 % (03/24 1800) FiO2 (%):  [30 %-50 %] 30 % (03/24 1625) Weight:  [80.1 kg (176 lb 9.4 oz)] 80.1 kg (176 lb 9.4 oz) (03/24 0100)  Intake/Output from previous day: 03/23 0701 - 03/24 0700 In: 172.3 [I.V.:12.3; IV Piggyback:160] Out: 250 [Emesis/NG output:250] Intake/Output this shift: Total I/O In: 1091.8 [I.V.:114.4; Blood:872.3; IV ZSMOLMBEM:754] Out: 450 [Emesis/NG output:450] Nutritional status: Diet NPO time specified  Neurologic Exam: Ment: Off propofol. Does not arouse to stimulation. No eye opening spontaneously or to stimuli. No attempts to communicate. Not following commands.  CN: Trace corneal reflex on right, weak corneal reflex on left. Pupils with equivocal reactivity. Face flaccidly symmetric. Motor/Sensory: No movement to noxious stimuli in any limb.  Reflexes: Hypoactive.    Lab Results: Basic Metabolic Panel:  Recent Labs Lab 11/19/15 0250 11/19/15 0903 11/19/15 1227 11/19/15 1741 11/25/2015 0120 11/21/2015 1230  NA 128* 134*  --  131* 142 137  K >7.5* 4.1  --  6.3* 4.9 5.0  CL 92* 96*  --  92* 99* 97*  CO2 13* 21*  --  21* 28 24  GLUCOSE 188* 115*  --  180* 131* 155*  BUN 113* 55*  --  69* 53* 65*  CREATININE 15.52* 7.19*  --  9.76* 8.13* 9.21*  CALCIUM 9.0 8.0*  --  7.4* 7.6* 7.3*  MG  --   --   --   --  2.2 2.4  PHOS  --  6.6* 8.4*  --   7.1* 7.7*    Liver Function Tests:  Recent Labs Lab 11/19/15 0903 11/11/2015 0120  AST 381* 99*  ALT 232* 121*  ALKPHOS 278* 148*  BILITOT 1.0 1.1  PROT 6.0* 5.3*  ALBUMIN 3.0* 2.6*   No results for input(s): LIPASE, AMYLASE in the last 168 hours. No results for input(s): AMMONIA in the last 168 hours.  CBC:  Recent Labs Lab 11/19/15 0250 11/19/15 0903 11/19/15 1741 11/21/2015 0120 11/09/2015 0121 11/13/2015 1230  WBC 18.4* 17.0* 13.5* 14.2*  --  14.4*  NEUTROABS  --   --   --  9.7*  --   --   HGB 12.1* 11.3* 7.2* 6.4*  --  8.4*  HCT 38.7* 33.9* 22.0* 19.8*  --  26.1*  MCV 85.3 80.3 80.5 80.8  --  81.3  PLT 257 232 173 195 185 184    Cardiac Enzymes:  Recent Labs Lab 11/19/15 0250 11/19/2015 0120 11/03/2015 1230  TROPONINI 0.17* 0.50* 0.39*    Lipid Panel:  Recent Labs Lab 11/19/15 0903 11/05/2015 0220  TRIG 166* 186*    CBG:  Recent Labs Lab 11/26/2015 0149 11/25/2015 0419 11/26/2015 0725 11/02/2015 1225 11/22/2015 1506  GLUCAP 115* 109* 130* 143* 142*    Microbiology: Results for orders placed or performed during the hospital encounter of  11/19/15  MRSA PCR Screening     Status: None   Collection Time: 11/19/15  5:27 AM  Result Value Ref Range Status   MRSA by PCR NEGATIVE NEGATIVE Final    Comment:        The GeneXpert MRSA Assay (FDA approved for NASAL specimens only), is one component of a comprehensive MRSA colonization surveillance program. It is not intended to diagnose MRSA infection nor to guide or monitor treatment for MRSA infections.     Coagulation Studies:  Recent Labs  11/19/15 0250 11/19/15 1741 11/19/15 2056 11/03/2015 0121  LABPROT 74.2* 19.9* 15.3* 15.4*  INR 9.74* 1.69 1.19 1.21    Imaging: Dg Abd 1 View  11/19/2015  CLINICAL DATA:  NG tube placement.  Patient was found unresponsive. EXAM: ABDOMEN - 1 VIEW COMPARISON:  None. FINDINGS: Gaseous distention of the stomach. Gaseous distention of the upper abdominal small  bowel. Changes likely to represent obstruction. Fold thickening of the small bowel could also indicate enteritis. Enteric tube is not visualized within the field of view of this study. IMPRESSION: Abnormal bowel gas pattern with distended stomach and distended small bowel with small bowel fold thickening. Changes could represent obstruction and/or enteritis. Enteric tube is not visualized within the field of view. Electronically Signed   By: Lucienne Capers M.D.   On: 11/19/2015 04:37   Ct Head Wo Contrast  11/19/2015  CLINICAL DATA:  Post cardiac arrest.  Intubated.  Unresponsive. EXAM: CT HEAD WITHOUT CONTRAST TECHNIQUE: Contiguous axial images were obtained from the base of the skull through the vertex without intravenous contrast. COMPARISON:  None FINDINGS: Ventricle size is normal. Negative for acute or chronic infarction. Negative for hemorrhage or fluid collection. Negative for mass or edema. No shift of the midline structures. Calvarium is intact. Mucosal edema in the paranasal sinuses.  Air-fluid levels.  NG tube. IMPRESSION: Normal CT of the brain Sinusitis Electronically Signed   By: Franchot Gallo M.D.   On: 11/19/2015 18:26   Dg Chest Port 1 View  11/01/2015  CLINICAL DATA:  Endotracheal tube post trans port. EXAM: PORTABLE CHEST 1 VIEW COMPARISON:  11/19/2015 FINDINGS: Endotracheal tube tip is low, measuring 1.5 cm above carina. Shallow inspiration with atelectasis or infiltration in the right lung base. No pneumothorax. Mild cardiac enlargement. Calcification of the aorta. Nodular opacity seen previously in the right upper lung are less prominent. IMPRESSION: Endotracheal tube tip measures 1.5 cm above the carina. Shallow inspiration with infiltration or atelectasis in the right lung base. Cardiac enlargement. Electronically Signed   By: Lucienne Capers M.D.   On: 11/19/2015 01:30   Dg Chest Port 1 View  11/19/2015  CLINICAL DATA:  Cardiac arrest.  Intubation. EXAM: PORTABLE CHEST 1 VIEW  COMPARISON:  None. FINDINGS: Endotracheal tube with tip measuring 3.3 cm above the carina. Shallow inspiration. Cardiac enlargement without vascular congestion. There is a focal nodule in the right upper lung measuring 1.9 cm diameter. There is additional nodular consolidation or confluent nodules demonstrated in the right perihilar and infrahilar region. Changes may represent pneumonia although neoplasm should be excluded. Consider CT chest for further evaluation. No blunting of costophrenic angles. No pneumothorax. Visualized bones appear grossly intact. IMPRESSION: Cardiac enlargement. Nodular opacities in the right lung. CT suggested for further evaluation. Electronically Signed   By: Lucienne Capers M.D.   On: 11/19/2015 03:36    Medications:   Current facility-administered medications:  .  0.9 %  sodium chloride infusion, 250 mL, Intravenous, PRN, Corey Harold,  NP .  0.9 %  sodium chloride infusion, , Intravenous, Once, Corey Harold, NP .  antiseptic oral rinse solution (CORINZ), 7 mL, Mouth Rinse, 10 times per day, Corey Harold, NP, 7 mL at 11/19/2015 1817 .  chlorhexidine gluconate (SAGE KIT) (PERIDEX) 0.12 % solution 15 mL, 15 mL, Mouth Rinse, BID, Corey Harold, NP, 15 mL at 11/14/2015 0746 .  insulin aspart (novoLOG) injection 2-6 Units, 2-6 Units, Subcutaneous, 6 times per day, Corey Harold, NP, 2 Units at 10/28/2015 1539 .  levETIRAcetam (KEPPRA) 1,500 mg in sodium chloride 0.9 % 100 mL IVPB, 1,500 mg, Intravenous, Q12H, Marliss Coots, PA-C, 1,500 mg at 11/12/2015 1817 .  pantoprazole (PROTONIX) injection 40 mg, 40 mg, Intravenous, QHS, Corey Harold, NP, 40 mg at 10/29/2015 0151 .  piperacillin-tazobactam (ZOSYN) IVPB 2.25 g, 2.25 g, Intravenous, Q8H, Veronda P Bryk, RPH, 2.25 g at 11/02/2015 1400 .  propofol (DIPRIVAN) 1000 MG/100ML infusion, 0-50 mcg/kg/min, Intravenous, Continuous, Corey Harold, NP, Last Rate: 17.2 mL/hr at 11/05/2015 1403, 35 mcg/kg/min at 11/25/2015 1403 .  valproate  (DEPACON) 1,600 mg in dextrose 5 % 50 mL IVPB, 1,600 mg, Intravenous, Once, Kerney Elbe, MD .  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 Presentation is also complicated by seizure activity with status epilepticus. Prognosis cannot be determined at this point but is likely poor.  Recommendations: 1. When stable, obtain repeat CT scan of the head or MRI of the brain to evaluate for possible anoxic injury, including cerebral edema 2. Continue bedside EEG and video monitoring  3. Keppra increased to 1500 mg IV every 12 hours 4. Continue propofol IV for seizure control. We are titrating based upon clinical seizure activity and EEG. Call neurology if there are acute changes.  5. Depakote IV load 20 mg/kg has been ordered. Scheduled Depakote at 5 mg/kg TID to follow (ordered).  Kerney Elbe, MD 11/21/2015, 6:21 PM

## 2015-11-20 NOTE — Care Management Note (Signed)
Case Management Note  Patient Details  Name: Spencer Lopez MRN: [id number] Date of Birth: 07/15/1971  Subjective/Objective:  Pt admitted on 11/21/2015 with cardiac arrest, seizures.  PTA, pt independent, lived with parents.                   Action/Plan: Pt currently sedated and on ventilator.  Will follow progress.    Expected Discharge Date:                  Expected Discharge Plan:     In-House Referral:     Discharge planning Services  CM Consult  Post Acute Care Choice:    Choice offered to:     DME Arranged:    DME Agency:     HH Arranged:    HH Agency:     Status of Service:  In process, will continue to follow  Medicare Important Message Given:    Date Medicare IM Given:    Medicare IM give by:    Date Additional Medicare IM Given:    Additional Medicare Important Message give by:     If discussed at Long Length of Stay Meetings, dates discussed:    Additional Comments:  Quintella BatonJulie W. Natascha Edmonds, RN, BSN  Trauma/Neuro ICU Case Manager [phone]

## 2015-11-20 NOTE — Progress Notes (Signed)
CRITICAL VALUE ALERT  Critical value received:   Trop: 0.50  Date of notification:  11/07/2015  Time of notification:  0715  Critical value read back:Yes.    Nurse who received alert:  Fransisco HertzJones, Dollie Bressi D, RN    MD notified (1st page):    Time of first page:    MD notified (2nd page):  Time of second page:  Responding MD:    Time MD responded:

## 2015-11-20 NOTE — Progress Notes (Signed)
  Echocardiogram 2D Echocardiogram has been performed.  Janalyn HarderWest, Roxanne Orner R 2016-04-01, 3:15 PM

## 2015-11-20 NOTE — Progress Notes (Signed)
STAT LTM EEG started 

## 2015-11-20 NOTE — Progress Notes (Addendum)
Follow up on patient: Patient had Propofol turned off and patient noted to have rhythmic R> left arm/neck and face. This coincided with spikes on the EEG. Will turn Propofol back on and increase Keppra to 1500 BID. Will contact [facility] to have formal reading on EEG.   Intubated.  No pupillary reaction  Corneal intact on the left eye rhythmic chewing on the tube Flaccid throughout  Felicie MornDavid Deondrae Mcgrail PA-C [facility] [phone]  11/02/2015, 10:03 AM

## 2015-11-20 NOTE — Procedures (Addendum)
Electroencephalogram report- LTM  Ordering Physician : Dr. Otelia LimesLindzen EEG number: [phone]     Beginning date and time: 11/22/2015 8AM Ending date and time:  10/29/2015 8AM  Day of study: 4  REPORT: The tracing consists of burst suppression pattern with bursts consisting of frontally predominant delta-theta activity in the early part of the recording. The suppression lasted  4-6 seconds between 1-3 seconds of bursts. Periodic discharges increase during later part of the recording, reaching 1-1.5Hz  with intermixed suppression.  There were no pushbutton activations events during this recording.   INTERPRETATION: This is an abnormal EEG due to: 1) Burst suppression pattern during early part of the recording 2) Generalized periodic discharges reaching 1-1.5Hz  during later part of the recording.  Clinical Correlation: This EEG is consistent with nonspecific severe diffuse cerebral dysfunction.  Generalized periodic discharges seen in the setting of cardiac arrest can be associated poor prognosis.   ----------------------------------------------------------------------------------------------------------------- Beginning date and time: 11/21/2015 8AM Ending date and time:  11/22/2015 8AM  Day of study: 3  REPORT: The tracing consists of burst suppression pattern with bursts consisting of frontally predominant delta-theta activity. The suppression lasted  4-6 seconds between 1-3 seconds of bursts. There were no pushbutton activations events during this recording.   INTERPRETATION: This is an abnormal EEG due to: 1) Burst suppression pattern  Clinical Correlation: There is increased suppression compared to the previous day's recording, likely due to increase in sedating medications. No electrographic seizures were seen.  --------------------------------------------------------------------------------------------------------------------------   Beginning date and time: 10/31/2015 515PM Ending  date and time:  11/21/2015 8AM  Day of study: 2   REPORT: The background activity consists of low voltage background with superimposed 1-3 Hz generalized periodic discharges.  Discharges were more prominent in the frontocentral leads.   There were no pushbutton activations events during this recording.   INTERPRETATION: This is an abnormal EEG due to: 1) Generalized periodic discharges, that occasionally increase in frequency to more than 3 Hz, consistent with electrographic seizures 2) Diffuse slowing   Clinical Correlation: This EEG is consistent with nonspecific severe diffuse cerebral dysfunction.  Generalized periodic discharges seen in the setting of cardiac arrest can be associated poor prognosis. The discharges are of lower amplitude with increase suppression of the background when compared to the previous days recording, likely due to increase in sedating medications. Clinical correlation is required.       ------------------------------------------------------------------------------------------------------  Beginning date and time: 11/02/2015 2:20AM Ending date and time:  11/01/2015 515PM  Day of study: 1  Medications include: Keppra, Propofol  MENTAL STATUS (per technician's notes): Intubated. Sedated. Unresponsive.  HISTORY: This 24 hours of intensive EEG monitoring with simultaneous video monitoring was performed for this patient with unresponsiveness and cardiac arrest.  Patient is now intubated and sedated.  This EEG was requested to rule out subclinical electrographic seizures.  TECHNICAL DESCRIPTION:  The study consists of a continuous 16-channel multi-montage digital video EEG recording with twenty-one electrodes placed according to the International 10-20 System. Additional leads included eye leads, true temporal leads (T1, T2), and an EKG lead. Activation procedures were not done due to mental status.  REPORT: The background activity consists of Polymorphic delta  all, with superimposed 1-3 Hz generalized discharges.  Discharges were more prominent in the frontocentral leads.  These become more prominent during later part of the recording with increase in frequency reaching 3.5-4Hz , consistent with electrographic seizures.  No clear clinical activity was seen on the video.  There were no pushbutton activations events during this recording.  INTERPRETATION: This is an abnormal EEG due to: 1) Generalized periodic discharges, that occasionally increase in frequency to more than 3 Hz, consistent with electrographic seizures 2) Diffuse slowing   Clinical Correlation: This EEG is consistent with nonspecific severe diffuse cerebral dysfunction.  Generalized periodic discharges seen in the setting of cardiac arrest can be associated poor prognosis.  Clinical correlation is required.

## 2015-11-20 NOTE — Progress Notes (Addendum)
Call from MD. Discussed pt's sedation, vitals, and pt's presentation. Updated MD on this nurse titration of propofol. Instructed to increase propofol to .Will continue to monitor pt closely.

## 2015-11-20 NOTE — Consult Note (Signed)
Admission H&P    Chief Complaint: Status epilepticus following cardiac arrest.  HPI: Spencer Lopez is an 45 y.o. male history diabetes mellitus, hypertension, and end-stage renal disease on dialysis, found unresponsive after an unknown time and underwent CPR with ROSC established about 10 minutes after EMS arrived. Patient was taken to [facility] for further management. CT scan of his head was unremarkable. Blood sugar was in the 20s and patient's potassium was greater than 7.5. EEG was obtained which showed status epilepticus, consistent with clinical findings. He was given a loading dose of Keppra and was subsequently started on propofol. He has continued to have occasional noticeable twitches involving a short muscles and extremities. He was transferred to [facility] for further management, including continuous EEG monitoring for management of status epilepticus.  Past Medical History  Diagnosis Date  . Diabetes mellitus without complication (HCC)   . Hypertension   . Renal disorder   . Cardiac arrest (HCC)     11/19/2014  . Diabetes (HCC)     No past surgical history on file.  Family History  Problem Relation Age of Onset  . Family history unknown: Yes   Social History:  has no tobacco, alcohol, and drug history on file.  Allergies: No Known Allergies  Medications Prior to Admission  Medication Sig Dispense Refill  . DOPamine (INTROPIN) 3.2-5 MG/ML-% infusion Inject 0-1.64 mg/min into the vein continuous. 250 mL 0  . fentaNYL (SUBLIMAZE) 100 MCG/2ML injection Inject 2 mLs (100 mcg total) into the vein every 15 (fifteen) minutes as needed (to achieve RASS goal.). 2 mL 0  . insulin aspart (NOVOLOG) 100 UNIT/ML injection Inject 0-15 Units into the skin every 4 (four) hours. 10 mL 11  . levETIRAcetam 500 mg in sodium chloride 0.9 % 100 mL Inject 500 mg into the vein daily. 250 mL 0  . LORazepam (ATIVAN) 2 MG/ML injection Inject 1-2 mLs (2-4 mg total) into the vein  every 10 (ten) minutes as needed (seizures). 1 mL 0  . pantoprazole (PROTONIX) 40 MG injection Inject 40 mg into the vein every 12 (twelve) hours. 1 each 0  . piperacillin-tazobactam (ZOSYN) 3.375 GM/50ML IVPB Inject 50 mLs (3.375 g total) into the vein every 12 (twelve) hours. 50 mL 0  . propofol (DIPRIVAN) 1000 MG/100ML EMUL injection Inject 410-6,560 mcg/min into the vein continuous. 100 mL 0    ROS: Unavailable due to patient's unresponsive state.  Physical Examination: Temperature 100.1 F (37.8 C), temperature source Axillary.  HEENT-  Normocephalic, no lesions, without obvious abnormality.  Normal external eye and conjunctiva.  Normal TM's bilaterally.  Normal auditory canals and external ears. Normal external nose, mucus membranes and septum.  Normal pharynx. Neck supple with no masses, nodes, nodules or enlargement. Cardiovascular - regular rate and rhythm, S1, S2 normal, no murmur, click, rub or gallop Lungs - chest clear, no wheezing, rales, normal symmetric air entry Abdomen - soft, non-tender; bowel sounds normal; no masses,  no organomegaly Extremities - no joint deformities, effusion, or inflammation and no edema  Neurologic Examination: Patient was intubated and on mechanical ventilation. He was unresponsive to external stimuli, including noxious stimuli. No spontaneous respirations were noted. He was receiving propofol at 5 mcg/kg/m. Pupils were equal and did not react to light. Extraocular movements are absent with oculocephalic maneuvers. Corneal reflex was absent bilaterally. Face was symmetrical. Muscle tone was flaccid throughout. There was no abnormal posturing and patient had no spontaneous movements. Deep tendon reflexes were 1+ and symmetrical except for  absent ankle reflexes. Plantar responses were mute.  Results for orders placed or performed during the hospital encounter of 11/01/2015 (from the past 48 hour(s))  Glucose, capillary     Status: None    Collection Time: 11/02/2015 12:32 AM  Result Value Ref Range   Glucose-Capillary 69 65 - 99 mg/dL   Comment 1 Notify RN    Dg Abd 1 View  11/19/2015  CLINICAL DATA:  NG tube placement.  Patient was found unresponsive. EXAM: ABDOMEN - 1 VIEW COMPARISON:  None. FINDINGS: Gaseous distention of the stomach. Gaseous distention of the upper abdominal small bowel. Changes likely to represent obstruction. Fold thickening of the small bowel could also indicate enteritis. Enteric tube is not visualized within the field of view of this study. IMPRESSION: Abnormal bowel gas pattern with distended stomach and distended small bowel with small bowel fold thickening. Changes could represent obstruction and/or enteritis. Enteric tube is not visualized within the field of view. Electronically Signed   By: Burman NievesWilliam  Stevens M.D.   On: 11/19/2015 04:37   Ct Head Wo Contrast  11/19/2015  CLINICAL DATA:  Post cardiac arrest.  Intubated.  Unresponsive. EXAM: CT HEAD WITHOUT CONTRAST TECHNIQUE: Contiguous axial images were obtained from the base of the skull through the vertex without intravenous contrast. COMPARISON:  None FINDINGS: Ventricle size is normal. Negative for acute or chronic infarction. Negative for hemorrhage or fluid collection. Negative for mass or edema. No shift of the midline structures. Calvarium is intact. Mucosal edema in the paranasal sinuses.  Air-fluid levels.  NG tube. IMPRESSION: Normal CT of the brain Sinusitis Electronically Signed   By: Marlan Palauharles  Clark M.D.   On: 11/19/2015 18:26   Dg Chest Port 1 View  11/19/2015  CLINICAL DATA:  Cardiac arrest.  Intubation. EXAM: PORTABLE CHEST 1 VIEW COMPARISON:  None. FINDINGS: Endotracheal tube with tip measuring 3.3 cm above the carina. Shallow inspiration. Cardiac enlargement without vascular congestion. There is a focal nodule in the right upper lung measuring 1.9 cm diameter. There is additional nodular consolidation or confluent nodules demonstrated in the  right perihilar and infrahilar region. Changes may represent pneumonia although neoplasm should be excluded. Consider CT chest for further evaluation. No blunting of costophrenic angles. No pneumothorax. Visualized bones appear grossly intact. IMPRESSION: Cardiac enlargement. Nodular opacities in the right lung. CT suggested for further evaluation. Electronically Signed   By: Burman NievesWilliam  Stevens M.D.   On: 11/19/2015 03:36    Assessment/Plan 45 year old man with hypertension, diabetes mellitus and end-stage renal disease on dialysis, status post cardiac arrest with probable inferior anoxic brain injury. Presentation is also complicated by seizure activity with status epilepticus. Prognosis cannot be determined at this point but is likely poor.  Plan: 1. Repeat CT scan of the head or MRI of the brain to evaluate for possible anoxic injury, including cerebral edema 2. Continuous EEG and video monitoring to aid in management of status epilepticus 3. Keppra 1000 mg IV every 12 hours 4. Continue propofol IV as needed for seizure control  We will continue to follow this patient with you.  C.R. Roseanne RenoStewart, MD [facility]  11/10/2015, 1:28 AM

## 2015-11-20 NOTE — Progress Notes (Signed)
Pharmacy Antibiotic Note  Spencer Lopez is a 45 y.o. male admitted on 11/17/2015 with aspiration s/p cardiac arrest.  Pharmacy has been consulted for Zosyn dosing.  Plan: - Continue Zosyn 2.25g IV Q8H. *Pharmacy will sign-off and only follow peripherally as no dose adjustments are anticipated. Thank you for the consult.   Temp (24hrs), Avg:99.8 F (37.7 C), Min:98.6 F (37 C), Max:101.3 F (38.5 C)   Recent Labs Lab 11/19/15 0250 11/19/15 0903 11/19/15 1227 11/19/15 1741 10/31/2015 0115 11/03/2015 0120  WBC 18.4* 17.0*  --  13.5*  --  14.2*  CREATININE 15.52* 7.19*  --  9.76*  --  8.13*  LATICACIDVEN  --   --  1.8  --  1.8  --     Estimated Creatinine Clearance: 11 mL/min (by C-G formula based on Cr of 8.13).    No Known Allergies    Thank you for allowing pharmacy to be a part of this patient's care.    Lysle Pearlachel Jianna Drabik, PharmD, BCPS Pager # [phone] 11/25/2015 8:10 AM

## 2015-11-20 NOTE — Progress Notes (Signed)
Note deleted by Arrie EasternBeth A Ginny Loomer, RN 11/01/2015 6:48 AM  Arrie EasternBeth A Ankit Degregorio, RN Registered Nurse Deleted  Progress Notes 11/19/2015 8:52 PM    Expand All Collapse All   [facility] for continuous EEG monitoring. Dialysis initiated until pt transfer due to increased potassium and seizure activity. Family updated and [facility].       Deleted by: Arrie EasternBeth A Larri Yehle, RN at 11/21/2015 6:48 AM  Chart Correction History     Date/Time User Action     11/19/2015 6:48 AM Arrie EasternBeth A Amier Hoyt, RN Delete Data

## 2015-11-21 ENCOUNTER — Inpatient Hospital Stay (HOSPITAL_COMMUNITY): Payer: Self-pay

## 2015-11-21 DIAGNOSIS — J9601 Acute respiratory failure with hypoxia: Secondary | ICD-10-CM

## 2015-11-21 DIAGNOSIS — G40901 Epilepsy, unspecified, not intractable, with status epilepticus: Secondary | ICD-10-CM

## 2015-11-21 DIAGNOSIS — Z992 Dependence on renal dialysis: Secondary | ICD-10-CM

## 2015-11-21 DIAGNOSIS — N186 End stage renal disease: Secondary | ICD-10-CM

## 2015-11-21 LAB — PREPARE FRESH FROZEN PLASMA
UNIT DIVISION: 0
UNIT DIVISION: 0
UNIT DIVISION: 0
Unit division: 0

## 2015-11-21 LAB — TYPE AND SCREEN
ABO/RH(D): AB POS
Antibody Screen: NEGATIVE
UNIT DIVISION: 0
Unit division: 0

## 2015-11-21 LAB — CBC
HEMATOCRIT: 24.7 % — AB (ref 39.0–52.0)
Hemoglobin: 8.3 g/dL — ABNORMAL LOW (ref 13.0–17.0)
MCH: 27.4 pg (ref 26.0–34.0)
MCHC: 33.6 g/dL (ref 30.0–36.0)
MCV: 81.5 fL (ref 78.0–100.0)
PLATELETS: 175 10*3/uL (ref 150–400)
RBC: 3.03 MIL/uL — ABNORMAL LOW (ref 4.22–5.81)
RDW: 15.6 % — AB (ref 11.5–15.5)
WBC: 13.7 10*3/uL — AB (ref 4.0–10.5)

## 2015-11-21 LAB — BASIC METABOLIC PANEL
Anion gap: 16 — ABNORMAL HIGH (ref 5–15)
BUN: 78 mg/dL — AB (ref 6–20)
CO2: 24 mmol/L (ref 22–32)
CREATININE: 10.38 mg/dL — AB (ref 0.61–1.24)
Calcium: 7.3 mg/dL — ABNORMAL LOW (ref 8.9–10.3)
Chloride: 99 mmol/L — ABNORMAL LOW (ref 101–111)
GFR calc Af Amer: 6 mL/min — ABNORMAL LOW (ref >60)
GFR, EST NON AFRICAN AMERICAN: 5 mL/min — AB (ref >60)
GLUCOSE: 137 mg/dL — AB (ref 65–99)
POTASSIUM: 4.7 mmol/L (ref 3.5–5.1)
SODIUM: 139 mmol/L (ref 135–145)

## 2015-11-21 LAB — PHOSPHORUS: PHOSPHORUS: 7.7 mg/dL — AB (ref 2.5–4.6)

## 2015-11-21 LAB — GLUCOSE, CAPILLARY
GLUCOSE-CAPILLARY: 122 mg/dL — AB (ref 65–99)
GLUCOSE-CAPILLARY: 99 mg/dL (ref 65–99)
GLUCOSE-CAPILLARY: 116 mg/dL — AB (ref 65–99)
Glucose-Capillary: 129 mg/dL — ABNORMAL HIGH (ref 65–99)
Glucose-Capillary: 106 mg/dL — ABNORMAL HIGH (ref 65–99)

## 2015-11-21 LAB — PROCALCITONIN: PROCALCITONIN: 26.69 ng/mL

## 2015-11-21 MED ORDER — SODIUM CHLORIDE 0.9 % IV SOLN
32.0000 mg/h | INTRAVENOUS | Status: DC
  Administered 2015-11-21 – 2015-11-22 (×8): 32 mg/h via INTRAVENOUS
  Administered 2015-11-23: 17 mg/h via INTRAVENOUS
  Administered 2015-11-23 (×2): 32 mg/h via INTRAVENOUS
  Filled 2015-11-21 (×12): qty 20

## 2015-11-21 MED ORDER — PENTAFLUOROPROP-TETRAFLUOROETH EX AERO: 1.0000 "application " | INHALATION_SPRAY | CUTANEOUS | Status: DC | PRN

## 2015-11-21 MED ORDER — HEPARIN SODIUM (PORCINE) 1000 UNIT/ML DIALYSIS: 1000.0000 [IU] | INTRAMUSCULAR | Status: DC | PRN

## 2015-11-21 MED ORDER — SODIUM CHLORIDE 0.9 % IV SOLN: 100.0000 mL | INTRAVENOUS | Status: DC | PRN

## 2015-11-21 MED ORDER — PHENYLEPHRINE HCL 10 MG/ML IJ SOLN
0.0000 ug/min | INTRAVENOUS | Status: DC
  Administered 2015-11-21: 20 ug/min via INTRAVENOUS
  Filled 2015-11-21 (×3): qty 4

## 2015-11-21 MED ORDER — LIDOCAINE HCL (PF) 1 % IJ SOLN: 5.0000 mL | INTRAMUSCULAR | Status: DC | PRN

## 2015-11-21 MED ORDER — SODIUM CHLORIDE 0.9 % IV SOLN
32.0000 mg/h | INTRAVENOUS | Status: DC
  Administered 2015-11-21 (×2): 16 mg/h via INTRAVENOUS
  Filled 2015-11-21 (×3): qty 10

## 2015-11-21 MED ORDER — LIDOCAINE-PRILOCAINE 2.5-2.5 % EX CREA
1.0000 "application " | TOPICAL_CREAM | CUTANEOUS | Status: DC | PRN
  Filled 2015-11-21: qty 5

## 2015-11-21 MED ORDER — HEPARIN SODIUM (PORCINE) 5000 UNIT/ML IJ SOLN
5000.0000 [IU] | Freq: Three times a day (TID) | INTRAMUSCULAR | Status: DC
  Administered 2015-11-21 – 2015-11-23 (×5): 5000 [IU] via SUBCUTANEOUS
  Filled 2015-11-21 (×5): qty 1

## 2015-11-21 MED ORDER — ALTEPLASE 2 MG IJ SOLR: 2.0000 mg | Freq: Once | INTRAMUSCULAR | Status: DC | PRN

## 2015-11-21 MED ORDER — VALPROATE SODIUM 500 MG/5ML IV SOLN
500.0000 mg | Freq: Three times a day (TID) | INTRAVENOUS | Status: DC
  Administered 2015-11-21 – 2015-11-23 (×6): 500 mg via INTRAVENOUS
  Filled 2015-11-21 (×8): qty 5

## 2015-11-21 NOTE — Consult Note (Signed)
Renal Service Consult Note [facility]  Spencer ParisRoosevelt Lopez 11/21/2015 Spencer Lopez D Requesting Physician:  Dr Jamison NeighborNestor  Reason for Consult:  ESRD pt sp cardiac arrest HPI: The patient is a 45 y.o. year-old with hx of DM and HTN , ESRD < 1 year on HD in [place] at [facility] schedule.  Had cardiac arrest on 3/23 Thrusday early am, found unresponsive at home. He had missed last Saturdays dialysis but went on Tuesday.  Had been drinking orange juice that night per the notes. K was high > 7.5.  Patient was coded for asystole and had ROSC.  He got dialysis on 3/23 early am and later that day K rebounded back up to 6.3 so he got another HD in the evening on 3/23.  Now K is normal.  Asked to see for HD today.  Patient not waking up yet.  Had myoclonic seizure activity after arrest and has continuous EEG monitor in place.     ROS  n/a   Past Medical History  Past Medical History  Diagnosis Date  . Diabetes mellitus without complication (HCC)   . Hypertension   . Renal disorder   . Cardiac arrest (HCC)     11/19/2014  . Diabetes [facility](HCC)    Past Surgical History No past surgical history on file. Family History  Family History  Problem Relation Age of Onset  . Family history unknown: Yes   Social History  has no tobacco, alcohol, and drug history on file. Allergies No Known Allergies Home medications Prior to Admission medications   Medication Sig Start Date End Date Taking? Authorizing Provider  DOPamine (INTROPIN) 3.2-5 MG/ML-% infusion Inject 0-1.64 mg/min into the vein continuous. 11/19/15   Altamese DillingVaibhavkumar Vachhani, MD  fentaNYL (SUBLIMAZE) 100 MCG/2ML injection Inject 2 mLs (100 mcg total) into the vein every 15 (fifteen) minutes as needed (to achieve RASS goal.). 11/19/15   Altamese DillingVaibhavkumar Vachhani, MD  insulin aspart (NOVOLOG) 100 UNIT/ML injection Inject 0-15 Units into the skin every 4 (four) hours. 11/19/15   Altamese DillingVaibhavkumar Vachhani, MD  levETIRAcetam 500 mg in sodium  chloride 0.9 % 100 mL Inject 500 mg into the vein daily. 11/16/2015   Altamese DillingVaibhavkumar Vachhani, MD  LORazepam (ATIVAN) 2 MG/ML injection Inject 1-2 mLs (2-4 mg total) into the vein every 10 (ten) minutes as needed (seizures). 11/19/15   Altamese DillingVaibhavkumar Vachhani, MD  pantoprazole (PROTONIX) 40 MG injection Inject 40 mg into the vein every 12 (twelve) hours. 11/19/15   Altamese DillingVaibhavkumar Vachhani, MD  piperacillin-tazobactam (ZOSYN) 3.375 GM/50ML IVPB Inject 50 mLs (3.375 g total) into the vein every 12 (twelve) hours. 11/19/15   Altamese DillingVaibhavkumar Vachhani, MD  propofol (DIPRIVAN) 1000 MG/100ML EMUL injection Inject 410-6,560 mcg/min into the vein continuous. 11/19/15   Altamese DillingVaibhavkumar Vachhani, MD   Liver Function Tests  Recent Labs Lab 11/19/15 0903 11/18/2015 0120  AST 381* 99*  ALT 232* 121*  ALKPHOS 278* 148*  BILITOT 1.0 1.1  PROT 6.0* 5.3*  ALBUMIN 3.0* 2.6*   No results for input(s): LIPASE, AMYLASE in the last 168 hours. CBC  Recent Labs Lab 11/22/2015 0120 11/27/2015 0121 11/14/2015 1230 11/21/15 0600  WBC 14.2*  --  14.4* 13.7*  NEUTROABS 9.7*  --   --   --   HGB 6.4*  --  8.4* 8.3*  HCT 19.8*  --  26.1* 24.7*  MCV 80.8  --  81.3 81.5  PLT 195 185 184 175   Basic Metabolic Panel  Recent Labs Lab 11/19/15 0250 11/19/15 0903 11/19/15 1227 11/19/15 1741  12-06-2015 0120 12/06/15 1230 11/21/15 0600  NA 128* 134*  --  131* 142 137 139  K >7.5* 4.1  --  6.3* 4.9 5.0 4.7  CL 92* 96*  --  92* 99* 97* 99*  CO2 13* 21*  --  21* GLUCOSE 188* 115*  --  180* 131* 155* 137*  BUN 113* 55*  --  69* 53* 65* 78*  CREATININE 15.52* 7.19*  --  9.76* 8.13* 9.21* 10.38*  CALCIUM 9.0 8.0*  --  7.4* 7.6* 7.3* 7.3*  PHOS  --  6.6* 8.4*  --  7.1* 7.7*  --     Filed Vitals:   11/21/15 0700 11/21/15 0721 11/21/15 0800 11/21/15 0900  BP: 107/59 107/59 110/58 110/60  Pulse: 55 54 53 52  Temp:   97.1 F (36.2 C)   TempSrc:   Axillary   Resp: Weight:      SpO2: 100% 100% 99% 100%    Exam Intubated, sedated on the vent, NG in place No rash, cyanosis or gangrene Sclera anicteric No jvd or bruits Chest clear bilat RRR no MRG Abd soft ntnd no mass or ascites +bs GU normal male defer MS no joint effusions or deformity Ext no edema / wounds/ ulcers Left forearm AVF +bruit Neuro is unresponsive on vent  CXR 3/25 - mostly clear, no edema  Dialysis: TTS [facility]  Assessment: 1 Cardiac arrest / asystole 3/23 2 VDRF 3 ESRD HD TTS in [place] 4 HTN was on bp meds at home (norvasc, losartan at minimum); family is getting home meds now. BP's soft , no need for antiHTN'sives at this time 5 Volume looks euvolemic 6 Seizures - acute, on Keppra IV and IV depakote   Plan - HD today, no hep/ no fluid off  Vinson Moselle MD [facility] pager [phone]    cell [phone] 11/21/2015, 10:07 AM

## 2015-11-21 NOTE — Progress Notes (Signed)
LTM day 2; checked pts head for skin breakdown; none noted. Leads all still under 5 kohms.

## 2015-11-21 NOTE — Progress Notes (Signed)
Subjective: Jerking movements have resolved. Propofol gtt had been increased to 70 mcg/kg/min at time of bedside evaluation by Neurology this morning.    Objective: Current vital signs: BP 92/50 mmHg  Pulse 50  Temp(Src) 96.9 F (36.1 C) (Axillary)  Resp 16  Wt 80.8 kg (178 lb 2.1 oz)  SpO2 100% Vital signs in last 24 hours: Temp:  [96.9 F (36.1 C)-100 F (37.8 C)] 96.9 F (36.1 C) (03/25 1154) Pulse Rate:  [50-79] 50 (03/25 1116) Resp:  [11-21] 16 (03/25 1116) BP: (92-131)/(50-91) 92/50 mmHg (03/25 1116) SpO2:  [97 %-100 %] 100 % (03/25 1116) FiO2 (%):  [30 %] 30 % (03/25 1116) Weight:  [80.8 kg (178 lb 2.1 oz)] 80.8 kg (178 lb 2.1 oz) (03/25 0201)  Intake/Output from previous day: 03/24 0701 - 03/25 0700 In: 2000.1 [I.V.:534.7; Blood:872.3; IV [id number]:916] Out: 460 [Urine:10; Emesis/NG output:450] Intake/Output this shift: Total I/O In: 110.7 [I.V.:110.7] Out: -  Nutritional status: Diet NPO time specified  Neurologic Exam: Ment: Assessed while sedated with propofol gtt. Does not arouse to stimulation. No eye opening spontaneously or to stimuli. No attempts to communicate. Not following commands.  CN: No response to tactile stimulation. Pupils unreactive. No doll's eye reflex (on sedation). Face flaccidly symmetric. Motor/Sensory: No movement to noxious stimuli in any limb.   Lab Results: Basic Metabolic Panel:  Recent Labs Lab 11/19/15 0903 11/19/15 1227 11/19/15 1741 11/06/2015 0120 10/29/2015 1230 11/21/15 0600 11/21/15 0910  NA 134*  --  131* 142 137 139  --   K 4.1  --  6.3* 4.9 5.0 4.7  --   CL 96*  --  92* 99* 97* 99*  --   CO2 21*  --  21* 28 24 24   --   GLUCOSE 115*  --  180* 131* 155* 137*  --   BUN 55*  --  69* 53* 65* 78*  --   CREATININE 7.19*  --  9.76* 8.13* 9.21* 10.38*  --   CALCIUM 8.0*  --  7.4* 7.6* 7.3* 7.3*  --   MG  --   --   --  2.2 2.4  --   --   PHOS 6.6* 8.4*  --  7.1* 7.7*  --  7.7*    Liver Function Tests:  Recent  Labs Lab 11/19/15 0903 11/27/2015 0120  AST 381* 99*  ALT 232* 121*  ALKPHOS 278* 148*  BILITOT 1.0 1.1  PROT 6.0* 5.3*  ALBUMIN 3.0* 2.6*   No results for input(s): LIPASE, AMYLASE in the last 168 hours. No results for input(s): AMMONIA in the last 168 hours.  CBC:  Recent Labs Lab 11/19/15 0903 11/19/15 1741 11/19/2015 0120 11/22/2015 0121 11/03/2015 1230 11/21/15 0600  WBC 17.0* 13.5* 14.2*  --  14.4* 13.7*  NEUTROABS  --   --  9.7*  --   --   --   HGB 11.3* 7.2* 6.4*  --  8.4* 8.3*  HCT 33.9* 22.0* 19.8*  --  26.1* 24.7*  MCV 80.3 80.5 80.8  --  81.3 81.5  PLT 232 173 195 185 184 175    Cardiac Enzymes:  Recent Labs Lab 11/19/15 0250 11/05/2015 0120 11/03/2015 1230  TROPONINI 0.17* 0.50* 0.39*    Lipid Panel:  Recent Labs Lab 11/19/15 0903 11/15/2015 0220  TRIG 166* 186*    CBG:  Recent Labs Lab 11/19/2015 1917 11/07/2015 2309 11/21/15 0314 11/21/15 0732 11/21/15 1114  GLUCAP 97 144* 129* 122* 99    Microbiology: Results for orders  placed or performed during the hospital encounter of 11/19/15  MRSA PCR Screening     Status: None   Collection Time: 11/19/15  5:27 AM  Result Value Ref Range Status   MRSA by PCR NEGATIVE NEGATIVE Final    Comment:        The GeneXpert MRSA Assay (FDA approved for NASAL specimens only), is one component of a comprehensive MRSA colonization surveillance program. It is not intended to diagnose MRSA infection nor to guide or monitor treatment for MRSA infections.     Coagulation Studies:  Recent Labs  11/19/15 0250 11/19/15 1741 11/19/15 2056 11/15/2015 0121  LABPROT 74.2* 19.9* 15.3* 15.4*  INR 9.74* 1.69 1.19 1.21    Imaging: Ct Head Wo Contrast  11/19/2015  CLINICAL DATA:  Post cardiac arrest.  Intubated.  Unresponsive. EXAM: CT HEAD WITHOUT CONTRAST TECHNIQUE: Contiguous axial images were obtained from the base of the skull through the vertex without intravenous contrast. COMPARISON:  None FINDINGS:  Ventricle size is normal. Negative for acute or chronic infarction. Negative for hemorrhage or fluid collection. Negative for mass or edema. No shift of the midline structures. Calvarium is intact. Mucosal edema in the paranasal sinuses.  Air-fluid levels.  NG tube. IMPRESSION: Normal CT of the brain Sinusitis Electronically Signed   By: Franchot Gallo M.D.   On: 11/19/2015 18:26   Dg Chest Port 1 View  11/21/2015  CLINICAL DATA:  45 year old male with a history of acute respiratory failure EXAM: PORTABLE CHEST 1 VIEW COMPARISON:  11/13/2015, 11/19/2015 FINDINGS: Cardiomediastinal silhouette unchanged with cardiomegaly. Similar appearance of mixed interstitial and airspace opacities in the hilar regions. No pleural effusion or pneumothorax. Endotracheal tube is unchanged, terminating suitably above the carina, 3.6 cm. Gastric tube projects over the mediastinum, terminating out of the field of view. No displaced fracture. Interval removal of defibrillator pads. IMPRESSION: Similar aeration of the lungs, with mild mixed interstitial and airspace opacities in the hilar regions, potentially combination of atelectasis, edema, or consolidation. Cardiomegaly. Interval removal of the defibrillator pads. Signed, Dulcy Fanny. Earleen Newport, DO [facility] Electronically Signed   By: Corrie Mckusick D.O.   On: 11/21/2015 08:54   Dg Chest Port 1 View  11/09/2015  CLINICAL DATA:  Endotracheal tube post trans port. EXAM: PORTABLE CHEST 1 VIEW COMPARISON:  11/19/2015 FINDINGS: Endotracheal tube tip is low, measuring 1.5 cm above carina. Shallow inspiration with atelectasis or infiltration in the right lung base. No pneumothorax. Mild cardiac enlargement. Calcification of the aorta. Nodular opacity seen previously in the right upper lung are less prominent. IMPRESSION: Endotracheal tube tip measures 1.5 cm above the carina. Shallow inspiration with infiltration or atelectasis in  the right lung base. Cardiac enlargement. Electronically Signed   By: Lucienne Capers M.D.   On: 11/13/2015 01:30    Medications:   Current facility-administered medications:  .  0.9 %  sodium chloride infusion, 250 mL, Intravenous, PRN, Corey Harold, NP .  0.9 %  sodium chloride infusion, , Intravenous, Once, Corey Harold, NP .  antiseptic oral rinse solution (CORINZ), 7 mL, Mouth Rinse, 10 times per day, Corey Harold, NP, 7 mL at 11/21/15 1143 .  chlorhexidine gluconate (SAGE KIT) (PERIDEX) 0.12 % solution 15 mL, 15 mL, Mouth Rinse, BID, Corey Harold, NP, 15 mL at 11/21/15 0839 .  heparin injection 5,000 Units, 5,000 Units, Subcutaneous, 3 times per day, Javier Glazier, MD .  insulin aspart (novoLOG) injection 2-6 Units, 2-6 Units, Subcutaneous,  6 times per day, Corey Harold, NP, 2 Units at 11/21/15 0840 .  levETIRAcetam (KEPPRA) 1,500 mg in sodium chloride 0.9 % 100 mL IVPB, 1,500 mg, Intravenous, Q12H, Marliss Coots, PA-C, 1,500 mg at 11/21/15 0503 .  midazolam (VERSED) 50 mg in sodium chloride 0.9 % 50 mL (1 mg/mL) infusion, 16 mg/hr, Intravenous, Continuous, Javier Glazier, MD, Last Rate: 16 mL/hr at 11/21/15 1141, 16 mg/hr at 11/21/15 1141 .  pantoprazole (PROTONIX) injection 40 mg, 40 mg, Intravenous, QHS, Corey Harold, NP, 40 mg at 11/26/2015 2148 .  piperacillin-tazobactam (ZOSYN) IVPB 2.25 g, 2.25 g, Intravenous, Q8H, Veronda P Bryk, RPH, 2.25 g at 11/21/15 0504 .  propofol (DIPRIVAN) 1000 MG/100ML infusion, 0-80 mcg/kg/min, Intravenous, Continuous, Wallie Char, Last Rate: 36.9 mL/hr at 11/21/15 1000, 75 mcg/kg/min at 11/21/15 1000 .  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 Presentation is also  complicated by seizure activity with status epilepticus. Prognosis cannot be determined while on sedation, but is likely poor.  Recommendations: 1. When stable, obtain repeat CT scan of the head or MRI of the brain to evaluate for possible anoxic injury, including cerebral edema 2. Continue bedside EEG and video monitoring  3. Continue Keppra at 1500 mg IV every 12 hours. 4. Continue scheduled Depakote at 5 mg/kg TID. 5. Continue propofol IV for seizure control. We are titrating based upon clinical seizure activity and EEG.  6. Start Versed gtt at 0.2 mg/kg/hr 7. Call neurology if there are acute changes.   Kerney Elbe, MD 11/21/2015, 12:53 PM

## 2015-11-21 NOTE — Progress Notes (Signed)
Reassessed at 6:00 PM. No adventitious movements seen clinically. EEG appears improved since last assessment. Still some occasional low amplitude sharp waves occurring in bursts. Increasing Versed gtt to 32 mg/hr.   Caryl PinaEric Caspar Favila, MD

## 2015-11-21 NOTE — Consult Note (Deleted)
Renal Service Consult Note [facility]  Spencer ParisRoosevelt Lopez 11/21/2015 [name] Requesting Physician:  Dr Jamison NeighborNestor  Reason for Consult:  ESRD pt sp cardiac arrest HPI: The patient is a 45 y.o. year-old with hx of DM and HTN , ESRD < 1 year on HD in [place] at [facility] schedule.  Had cardiac arrest on 3/23 Thrusday early am, found unresponsive at home. He had missed last Saturdays dialysis but went on Tuesday.  Had been drinking orange juice that night per the notes. K was high > 7.5.  Patient was coded for asystole and had ROSC.  He got dialysis on 3/23 early am and later that day K rebounded back up to 6.3 so he got another HD in the evening on 3/23.  Now K is normal.  Asked to see for HD today.  Patient not waking up yet.  Had myoclonic seizure activity after arrest and has continuous EEG monitor in place.     ROS  n/a   Past Medical History  Past Medical History  Diagnosis Date  . Diabetes mellitus without complication (HCC)   . Hypertension   . Renal disorder   . Cardiac arrest (HCC)     11/19/2014  . Diabetes [facility](HCC)    Past Surgical History No past surgical history on file. Family History  Family History  Problem Relation Age of Onset  . Family history unknown: Yes   Social History  has no tobacco, alcohol, and drug history on file. Allergies No Known Allergies Home medications Prior to Admission medications   Medication Sig Start Date End Date Taking? Authorizing Provider  DOPamine (INTROPIN) 3.2-5 MG/ML-% infusion Inject 0-1.64 mg/min into the vein continuous. 11/19/15   Altamese DillingVaibhavkumar Vachhani, MD  fentaNYL (SUBLIMAZE) 100 MCG/2ML injection Inject 2 mLs (100 mcg total) into the vein every 15 (fifteen) minutes as needed (to achieve RASS goal.). 11/19/15   Altamese DillingVaibhavkumar Vachhani, MD  insulin aspart (NOVOLOG) 100 UNIT/ML injection Inject 0-15 Units into the skin every 4 (four) hours. 11/19/15   Altamese DillingVaibhavkumar Vachhani, MD  levETIRAcetam 500 mg in sodium  chloride 0.9 % 100 mL Inject 500 mg into the vein daily. 11/16/2015   Altamese DillingVaibhavkumar Vachhani, MD  LORazepam (ATIVAN) 2 MG/ML injection Inject 1-2 mLs (2-4 mg total) into the vein every 10 (ten) minutes as needed (seizures). 11/19/15   Altamese DillingVaibhavkumar Vachhani, MD  pantoprazole (PROTONIX) 40 MG injection Inject 40 mg into the vein every 12 (twelve) hours. 11/19/15   Altamese DillingVaibhavkumar Vachhani, MD  piperacillin-tazobactam (ZOSYN) 3.375 GM/50ML IVPB Inject 50 mLs (3.375 g total) into the vein every 12 (twelve) hours. 11/19/15   Altamese DillingVaibhavkumar Vachhani, MD  propofol (DIPRIVAN) 1000 MG/100ML EMUL injection Inject 410-6,560 mcg/min into the vein continuous. 11/19/15   Altamese DillingVaibhavkumar Vachhani, MD   Liver Function Tests  Recent Labs Lab 11/19/15 0903 11/18/2015 0120  AST 381* 99*  ALT 232* 121*  ALKPHOS 278* 148*  BILITOT 1.0 1.1  PROT 6.0* 5.3*  ALBUMIN 3.0* 2.6*   No results for input(s): LIPASE, AMYLASE in the last 168 hours. CBC  Recent Labs Lab 11/22/2015 0120 11/27/2015 0121 11/14/2015 1230 11/21/15 0600  WBC 14.2*  --  14.4* 13.7*  NEUTROABS 9.7*  --   --   --   HGB 6.4*  --  8.4* 8.3*  HCT 19.8*  --  26.1* 24.7*  MCV 80.8  --  81.3 81.5  PLT 195 185 184 175   Basic Metabolic Panel  Recent Labs Lab 11/19/15 0250 11/19/15 0903 11/19/15 1227 11/19/15 1741  11/13/2015 0120 11/08/2015 1230 11/21/15 0600  NA 128* 134*  --  131* 142 137 139  K >7.5* 4.1  --  6.3* 4.9 5.0 4.7  CL 92* 96*  --  92* 99* 97* 99*  CO2 13* 21*  --  21* 28 24 24   GLUCOSE 188* 115*  --  180* 131* 155* 137*  BUN 113* 55*  --  69* 53* 65* 78*  CREATININE 15.52* 7.19*  --  9.76* 8.13* 9.21* 10.38*  CALCIUM 9.0 8.0*  --  7.4* 7.6* 7.3* 7.3*  PHOS  --  6.6* 8.4*  --  7.1* 7.7*  --     Filed Vitals:   11/21/15 0600 11/21/15 0700 11/21/15 0721 11/21/15 0800  BP: 110/60 107/59 107/59 110/58  Pulse: 56 55 54 53  Temp:    97.1 F (36.2 C)  TempSrc:    Axillary  Resp: 16 16 16 16   Weight:      SpO2: 99% 100% 100% 99%    Exam Intubated, sedated on the vent, NG in place No rash, cyanosis or gangrene Sclera anicteric No jvd or bruits Chest clear bilat RRR no MRG Abd soft ntnd no mass or ascites +bs GU normal male defer MS no joint effusions or deformity Ext no edema / wounds/ ulcers Left forearm AVF +bruit Neuro is unresponsive on vent  CXR 3/25 - mostly clear, no edema  Dialysis: TTS [facility]  Assessment: 1 Cardiac arrest / asystole 3/23 2 VDRF 3 ESRD HD TTS in [place] 4 HTN was on bp meds at home (norvasc, losartan at minimum); family is getting home meds now. BP's soft , no need for antiHTN'sives at this time 5 Volume looks euvolemic 6 Seizures - acute, on Keppra IV and IV depakote   Plan - HD today, no hep/ no fluid off  Vinson Moselleob Amberlynn Tempesta MD [facility] pager [phone]    cell [phone] 11/21/2015, 9:45 AM

## 2015-11-21 NOTE — Progress Notes (Signed)
Call from MD. Discussed pt's vitals and propofol rate. Order to increase propofol to . Will continue to monitor.

## 2015-11-21 NOTE — Progress Notes (Signed)
PULMONARY / CRITICAL CARE MEDICINE   Name: Spencer Lopez MRN: [id number] DOB: 01/29/1971    ADMISSION DATE:  11/18/2015 CONSULTATION DATE:  10/30/2015  [facility]  [facility]  CHIEF COMPLAINT:  Cardiac arrest  HISTORY OF PRESENT ILLNESS: Patient is intubated, no family is present at this time. History is taken from prior notes.  45 year old male with PMH as below, which includes ESRD on HD, DM, HTN. He was [facility] 3/23 AM s/p cardiac arrest. Patient had reportedly missed dialysis session(s). He was found down without pulse by brother who started CPR at scene and called EMS. ROSC was achieved by EMS within about 10 mins of the patient being found down. In ED he was in complete heart block, likely secondary to potassium >7.5. He was also hypoglycemic (20). He was intubated in ED. Dialysis catheter was placed and he was started on HD after temporizing measures (Ca++, insulin). He was not started on induced hypothermia due to temp 30C on admit.  Hosptial course complicated by coagulapathy (improved with Theodoro ParmaKCentra), Status epilepticus vs myoclonus (cEEG pending), and refractory shock. Patient transferred to ICU for PCCM admission.   SUBJECTIVE: No acute events overnight. Patient remains nonresponsive.  REVIEW OF SYSTEMS: Unable to assess given intubation & sedation.  VITAL SIGNS: BP 110/58 mmHg  Pulse 53  Temp(Src) 97.1 F (36.2 C) (Axillary)  Resp 16  Wt 80.8 kg (178 lb 2.1 oz)  SpO2 99%  HEMODYNAMICS:    VENTILATOR SETTINGS: Vent Mode:  [-] PRVC FiO2 (%):  [30 %] 30 % Set Rate:  [16 bmp] 16 bmp Vt Set:  [550 mL] 550 mL PEEP:  [5 cmH20] 5 cmH20 Plateau Pressure:  [8 cmH20-18 cmH20] 18 cmH20  INTAKE / OUTPUT: I/O last 3 completed shifts: In: 2172.4 [I.V.:547; Blood:872.3; IV Piggyback:753] Out: 710 [Urine:10; Emesis/NG output:700]  PHYSICAL EXAMINATION: General:  Sedated. Family at bedside. On continuous EEG. Integument:  Warm & dry. No rash on exposed skin.   HEENT:  No scleral injection or icterus. Endotracheal tube in place.  Cardiovascular:  Regular rate. No edema. No appreciable JVD.  Pulmonary:  Clear bilaterally to auscultation. Symmetric chest wall rise on ventilator. Abdomen: Soft. Hypoactive bowel sounds. Nondistended.  Neurological: Currently sedated on propofol. No cough, gag, or corneal reflexes. No withdrawal to pain in extremities.  LABS:  BMET  Recent Labs Lab 11/15/2015 0120 11/16/2015 1230 11/21/15 0600  NA 142 137 139  K 4.9 5.0 4.7  CL 99* 97* 99*  CO2 28 24 24   BUN 53* 65* 78*  CREATININE 8.13* 9.21* 10.38*  GLUCOSE 131* 155* 137*    Electrolytes  Recent Labs Lab 11/19/15 1227  11/06/2015 0120 11/02/2015 1230 11/21/15 0600  CALCIUM  --   < > 7.6* 7.3* 7.3*  MG  --   --  2.2 2.4  --   PHOS 8.4*  --  7.1* 7.7*  --   < > = values in this interval not displayed.  CBC  Recent Labs Lab 10/28/2015 0120 11/21/2015 0121 11/22/2015 1230 11/21/15 0600  WBC 14.2*  --  14.4* 13.7*  HGB 6.4*  --  8.4* 8.3*  HCT 19.8*  --  26.1* 24.7*  PLT 195 185 184 175    Coag's  Recent Labs Lab 11/19/15 1741 11/19/15 2056 11/09/2015 0121  APTT  --   --  37  INR 1.69 1.19 1.21    Sepsis Markers  Recent Labs Lab 11/19/15 1227 10/31/2015 0115 11/10/2015 0219 11/21/15 0600  LATICACIDVEN 1.8  1.8  --   --   PROCALCITON  --   --  24.84 26.69    ABG  Recent Labs Lab 11/19/15 0301 11/03/2015 0217  PHART 7.21* 7.469*  PCO2ART 34 40.1  PO2ART 225* 113*    Liver Enzymes  Recent Labs Lab 11/19/15 0903 11/19/2015 0120  AST 381* 99*  ALT 232* 121*  ALKPHOS 278* 148*  BILITOT 1.0 1.1  ALBUMIN 3.0* 2.6*    Cardiac Enzymes  Recent Labs Lab 11/19/15 0250 11/06/2015 0120 11/22/2015 1230  TROPONINI 0.17* 0.50* 0.39*    Glucose  Recent Labs Lab 11/06/2015 1225 11/04/2015 1506 11/14/2015 1917 11/21/2015 2309 11/21/15 0314 11/21/15 0732  GLUCAP 143* 142* 97 144* 129* 122*    Imaging No results found.   STUDIES:   CT head 3/23 > no acute abnormality EEG 3/23 > status epilepticus  Echo 3/24 >EF 40%, diffuse hypo cEEG 3/24 > 2 Hz generalized spike and slow wave activity c/w status Port CXR 3/25:  Endotracheal tube in good position. Somewhat globular appearing heart.  MICROBIOLOGY: MRSA PCR 3/23:  Negative  ANTIBIOTICS: Zosyn 3/23>>>  SIGNIFICANT EVENTS: 3/23 - cardiac arrest 3/24 - transfer to [facility]  LINES/TUBES: OETT 7.5 3/23>>> OGT 3/23>>> R fem CVL 3/23 >>> PIV x1  ASSESSMENT / PLAN:  NEUROLOGIC A:   Status Epilepticus Acute Anoxic Encephalopathy  P:   RASS goal: 0 Neurology following Continuous EEG Keppra IV Depacon IV Propofol gtt Starting Versed gtt at 0.2mg /kg/hr ( ) for 24-48 hours  PULMONARY A: Acute Hypoxic Respiratory Failure  P:  Full vent support WUA & SBT once seizures stop  CARDIOVASCULAR A: Cardiac Arrest - Unknown downtime. Likely secondary to hyperkalemia. Shock - Resolved.  P:  Telemetry monitoring Vitals per unit protocol  RENAL A:   ESRD on HD Hyperkalemia - Resolved. Hyponatremia - Resolved.  Pseudo-Hypocalcemia  P:   Nephrology consulted Trending electrolytes daily Plan for HD session at termination of Versed to clear active metabolite  GASTROINTESTINAL A:   Transaminitis - Likely shock liver.  P:   NPO Hold TF for now Trend LFT daily Protonix IV daily  HEMATOLOGIC A:   Anemia - Hgb stable. Coagulopathy - On warfarin (initial INR 9, received FFP, K[facility]ra at [facility])  P:  Trending cell counts daily w/ CBC Transfuse per ICU guideline SCDs  Start Heparin [place] q8hr Trending Coags daily  INFECTIOUS A:   Probable Aspiration  P:   Zosyn Day #3 Procalcitonin per algorithm  ENDOCRINE A:   H/O DM    P:   Accu-checks q4hr SSI per algorithm  FAMILY  - Updates: Mom and dad at bedside 3/25 by Dr. Jamison Neighbor & Neurology.  - Inter-disciplinary family meet or Palliative Care meeting due by:  3/30   TODAY'S SUMMARY:  45 year old male with ESRD and DM suffered cardiac arrest 3/23. Unknown downtime. ROSC after 10 mins CPR. Status epilepticus in setting likely anoxic injury. Transferred to [facility] for cEEG. Prognosis is poor. Case discussed with neurology & plan for 24-48 hours of continuous Versed infusion to obtain a flat line. Plan for hemodialysis session on Monday to clear active metabolite and reassess neurological status off sedation.  I have spent a total of 37 minutes of critical care time today caring for the patient, discussing the plan of care with family as well as neurology, & reviewing the patient's electronic medical record.  Donna Christen Jamison Neighbor, M.D. [facility] Pulmonary & Critical Care Pager:  [phone] After 3pm or if no response, call [phone] 11/21/2015 8:46  AM        

## 2015-11-22 DIAGNOSIS — R569 Unspecified convulsions: Secondary | ICD-10-CM

## 2015-11-22 DIAGNOSIS — T82868A Thrombosis of vascular prosthetic devices, implants and grafts, initial encounter: Secondary | ICD-10-CM

## 2015-11-22 LAB — COMPREHENSIVE METABOLIC PANEL
ALBUMIN: 2.2 g/dL — AB (ref 3.5–5.0)
ALK PHOS: 103 U/L (ref 38–126)
ALT: 56 U/L (ref 17–63)
ANION GAP: 15 (ref 5–15)
AST: 58 U/L — AB (ref 15–41)
BILIRUBIN TOTAL: 0.8 mg/dL (ref 0.3–1.2)
BUN: 43 mg/dL — AB (ref 6–20)
CALCIUM: 7.4 mg/dL — AB (ref 8.9–10.3)
CO2: 24 mmol/L (ref 22–32)
CREATININE: 7.08 mg/dL — AB (ref 0.61–1.24)
Chloride: 100 mmol/L — ABNORMAL LOW (ref 101–111)
GFR calc Af Amer: 10 mL/min — ABNORMAL LOW (ref >60)
GFR calc non Af Amer: 8 mL/min — ABNORMAL LOW (ref >60)
GLUCOSE: 139 mg/dL — AB (ref 65–99)
Potassium: 4.4 mmol/L (ref 3.5–5.1)
SODIUM: 139 mmol/L (ref 135–145)
TOTAL PROTEIN: 5.1 g/dL — AB (ref 6.5–8.1)

## 2015-11-22 LAB — CBC WITH DIFFERENTIAL/PLATELET
BASOS PCT: 1 %
Basophils Absolute: 0.1 10*3/uL (ref 0.0–0.1)
EOS ABS: 1.7 10*3/uL — AB (ref 0.0–0.7)
Eosinophils Relative: 16 %
HEMATOCRIT: 25.2 % — AB (ref 39.0–52.0)
Hemoglobin: 8.5 g/dL — ABNORMAL LOW (ref 13.0–17.0)
LYMPHS ABS: 1.4 10*3/uL (ref 0.7–4.0)
Lymphocytes Relative: 13 %
MCH: 27.7 pg (ref 26.0–34.0)
MCHC: 33.7 g/dL (ref 30.0–36.0)
MCV: 82.1 fL (ref 78.0–100.0)
MONOS PCT: 12 %
Monocytes Absolute: 1.2 10*3/uL — ABNORMAL HIGH (ref 0.1–1.0)
NEUTROS ABS: 6.3 10*3/uL (ref 1.7–7.7)
Neutrophils Relative %: 58 %
Platelets: 182 10*3/uL (ref 150–400)
RBC: 3.07 MIL/uL — AB (ref 4.22–5.81)
RDW: 15.9 % — ABNORMAL HIGH (ref 11.5–15.5)
WBC: 10.7 10*3/uL — AB (ref 4.0–10.5)

## 2015-11-22 LAB — PROCALCITONIN: PROCALCITONIN: 19.54 ng/mL

## 2015-11-22 LAB — MAGNESIUM: Magnesium: 2 mg/dL (ref 1.7–2.4)

## 2015-11-22 LAB — GLUCOSE, CAPILLARY
GLUCOSE-CAPILLARY: 120 mg/dL — AB (ref 65–99)
GLUCOSE-CAPILLARY: 122 mg/dL — AB (ref 65–99)
Glucose-Capillary: 107 mg/dL — ABNORMAL HIGH (ref 65–99)
Glucose-Capillary: 119 mg/dL — ABNORMAL HIGH (ref 65–99)
Glucose-Capillary: 120 mg/dL — ABNORMAL HIGH (ref 65–99)
Glucose-Capillary: 165 mg/dL — ABNORMAL HIGH (ref 65–99)
Glucose-Capillary: 96 mg/dL (ref 65–99)

## 2015-11-22 LAB — PROTIME-INR
INR: 1.74 — ABNORMAL HIGH (ref 0.00–1.49)
Prothrombin Time: 20.4 seconds — ABNORMAL HIGH (ref 11.6–15.2)

## 2015-11-22 LAB — VALPROIC ACID LEVEL: VALPROIC ACID LVL: 46 ug/mL — AB (ref 50.0–100.0)

## 2015-11-22 LAB — TRIGLYCERIDES: Triglycerides: 472 mg/dL — ABNORMAL HIGH (ref <150)

## 2015-11-22 LAB — APTT: APTT: 45 s — AB (ref 24–37)

## 2015-11-22 MED ORDER — DOPAMINE-DEXTROSE 3.2-5 MG/ML-% IV SOLN
0.0000 ug/kg/min | INTRAVENOUS | Status: DC
  Administered 2015-11-22: 5 ug/kg/min via INTRAVENOUS
  Administered 2015-11-23 (×3): 20 ug/kg/min via INTRAVENOUS
  Filled 2015-11-22 (×4): qty 250

## 2015-11-22 NOTE — Progress Notes (Signed)
Pt has bleeding AV fistula site, reported to Dr. Rolan BuccoNestor-CCM, MD CCM, MD advised me to call Hemodialysis.  Made calls to Hemodialysis - no answer because they aren't here on Sundays. Made call to Renal unit [id number] - they gave the number to call the on call nephologist - [phone]. Reported that pt's L forearm AV fistula is bleeding requiring frequent reinforcement with gauze. Left verbal message with on call answering service for a return call to [phone] Dr. Arlean HoppingSchertz with nephrology came by to check on pt, and called surgeon to come address bleeding fistula.  Spencer Lopez

## 2015-11-22 NOTE — Progress Notes (Signed)
PULMONARY / CRITICAL CARE MEDICINE   Name: Spencer Lopez MRN: [id number] DOB: 10/23/1970    ADMISSION DATE:  11/15/2015 CONSULTATION DATE:  11/17/2015  [facility]:  [facility]  CHIEF COMPLAINT:  Cardiac arrest  HISTORY OF PRESENT ILLNESS: Patient is intubated, no family is present at this time. History is taken from prior notes.  45 year old male with PMH as below, which includes ESRD on HD, DM, HTN. He was initially [facility] 3/23 AM s/p cardiac arrest. Patient had reportedly missed dialysis session(s). He was found down without pulse by brother who started CPR at scene and called EMS. ROSC was achieved by EMS within about 10 mins of the patient being found down. In ED he was in complete heart block, likely secondary to potassium >7.5. He was also hypoglycemic (20). He was intubated in ED. Dialysis catheter was placed and he was started on HD after temporizing measures (Ca++, insulin). He was not started on induced hypothermia due to temp 30C on admit.  Hosptial course complicated by coagulapathy (improved with Theodoro ParmaKCentra), Status epilepticus vs myoclonus (cEEG pending), and refractory shock. Patient transferred to ICU for PCCM admission.   SUBJECTIVE: No acute events overnight. Started on high dose Versed yesterday for seizure suppression. Fianc at bedside.  REVIEW OF SYSTEMS: Unable to assess given intubation & sedation.  VITAL SIGNS: BP 117/60 mmHg  Pulse 49  Temp(Src) 96.9 F (36.1 C) (Axillary)  Resp 20  Wt 87.9 kg (193 lb 12.6 oz)  SpO2 99%  HEMODYNAMICS:    VENTILATOR SETTINGS: Vent Mode:  [-] PRVC FiO2 (%):  [30 %-40 %] 40 % Set Rate:  [16 bmp] 16 bmp Vt Set:  [550 mL] 550 mL PEEP:  [5 cmH20] 5 cmH20 Plateau Pressure:  [18 cmH20-19 cmH20] 19 cmH20  INTAKE / OUTPUT: I/O last 3 completed shifts: In: 2865.4 [I.V.:1998.3; IV Piggyback:867] Out: -800 [Emesis/NG output:200]  PHYSICAL EXAMINATION: General:  Sedated. Fianc at bedside. On continuous EEG. Integument:   Warm & dry. No rash on exposed skin.  HEENT:  No scleral injection or icterus. Endotracheal tube in place.  Cardiovascular:  Regular rhythm but bradycardic. No edema. No appreciable JVD.  Pulmonary:  Clear bilaterally to auscultation. Symmetric chest wall rise on ventilator. Abdomen: Soft. Hypoactive bowel sounds. Nondistended.  Neurological: Currently sedated on propofol & versed. No cough, gag, or corneal reflexes. No withdrawal to pain in extremities.  LABS:  BMET  Recent Labs Lab 11/02/2015 1230 11/21/15 0600 11/22/15 0530  NA 137 139 139  K 5.0 4.7 4.4  CL 97* 99* 100*  CO2 24 24 24   BUN 65* 78* 43*  CREATININE 9.21* 10.38* 7.08*  GLUCOSE 155* 137* 139*    Electrolytes  Recent Labs Lab 11/26/2015 0120 11/19/2015 1230 11/21/15 0600 11/21/15 0910 11/22/15 0530  CALCIUM 7.6* 7.3* 7.3*  --  7.4*  MG 2.2 2.4  --   --  2.0  PHOS 7.1* 7.7*  --  7.7*  --     CBC  Recent Labs Lab 11/14/2015 1230 11/21/15 0600 11/22/15 0530  WBC 14.4* 13.7* 10.7*  HGB 8.4* 8.3* 8.5*  HCT 26.1* 24.7* 25.2*  PLT 184 175 182    Coag's  Recent Labs Lab 11/19/15 2056 11/11/2015 0121 11/22/15 0530  APTT  --  37 45*  INR 1.19 1.21 1.74*    Sepsis Markers  Recent Labs Lab 11/19/15 1227 11/05/2015 0115 10/29/2015 0219 11/21/15 0600 11/22/15 0530  LATICACIDVEN 1.8 1.8  --   --   --  PROCALCITON  --   --  24.84 26.69 19.54    ABG  Recent Labs Lab 11/19/15 0301 11/19/2015 0217  PHART 7.21* 7.469*  PCO2ART 34 40.1  PO2ART 225* 113*    Liver Enzymes  Recent Labs Lab 11/19/15 0903 11/10/2015 0120 11/22/15 0530  AST 381* 99* 58*  ALT 232* 121* 56  ALKPHOS 278* 148* 103  BILITOT 1.0 1.1 0.8  ALBUMIN 3.0* 2.6* 2.2*    Cardiac Enzymes  Recent Labs Lab 11/19/15 0250 11/05/2015 0120 11/21/2015 1230  TROPONINI 0.17* 0.50* 0.39*    Glucose  Recent Labs Lab 11/21/15 1114 11/21/15 1603 11/21/15 1936 11/22/15 0020 11/22/15 0355 11/22/15 0739  GLUCAP 99 106* 116*  107* 120* 122*    Imaging No results found.   STUDIES:  CT head 3/23 > no acute abnormality EEG 3/23 > status epilepticus  Echo 3/24 >EF 40%, diffuse hypo cEEG 3/24 > 2 Hz generalized spike and slow wave activity c/w status Port CXR 3/25:  Endotracheal tube in good position. Somewhat globular appearing heart.  MICROBIOLOGY: MRSA PCR 3/23:  Negative  ANTIBIOTICS: Zosyn 3/23>>>  SIGNIFICANT EVENTS: 3/23 - cardiac arrest 3/24 - transfer to [facility]  LINES/TUBES: OETT 7.5 3/23>>> OGT 3/23>>> R fem CVL 3/23 >>> PIV x1  ASSESSMENT / PLAN:  NEUROLOGIC A:   Status Epilepticus Acute Anoxic Encephalopathy  P:   RASS goal: 0 Neurology following Continuous EEG Keppra IV Depacon IV Propofol gtt Versed gtt - no titration  PULMONARY A: Acute Hypoxic Respiratory Failure  P:  Full vent support WUA & SBT once seizures stop  CARDIOVASCULAR A: Cardiac Arrest - Unknown downtime. Likely secondary to hyperkalemia. Shock - Previously resolved. Secondary to sedation. Bradycardia - Secondary to sedation.  P:  Telemetry monitoring Vitals per unit protocol Switch from Neo to Dopamine to maintain MAP & HR  RENAL A:   ESRD on HD Hyperkalemia - Resolved. Hyponatremia - Resolved.  Pseudo-Hypocalcemia  P:   Nephrology consulted Trending electrolytes daily Plan for HD session at termination of Versed to clear active metabolite  GASTROINTESTINAL A:   Transaminitis - Likely shock liver. Resolving.  P:   NPO Hold TF for now Trend LFT daily Protonix IV daily  HEMATOLOGIC A:   Anemia - Hgb stable. Coagulopathy - On warfarin (initial INR 9, received FFP, KCentra at [facility]). Resolved.  P:  Trending cell counts daily w/ CBC Transfuse per ICU guideline SCDs  Heparin [place] q8hr Trending Coags daily  INFECTIOUS A:   Probable Aspiration  P:   Zosyn Day #4 Procalcitonin per algorithm  ENDOCRINE A:   H/O DM    P:   Accu-checks q4hr SSI per algorithm  FAMILY   - Updates: Fianc at bedside 3/25 by Dr. Jamison Neighbor & Neurology.  - Inter-disciplinary family meet or Palliative Care meeting due by:  3/30   TODAY'S SUMMARY: 45 year old male with ESRD and DM suffered cardiac arrest 3/23. Unknown downtime. ROSC after 10 mins CPR. Status epilepticus in setting likely anoxic injury. Transferred to [facility] for cEEG. Prognosis is poor. Case discussed with neurology & plan for 24-48 hours of continuous Versed infusion to obtain a flat line. Plan for hemodialysis session on Monday to clear active metabolite and reassess neurological status off sedation. Patient is having some bradycardia with high levels of sedation therefore starting dopamine.  I have spent a total of 35 minutes of critical care time today caring for the patient, discussing the plan of care with fianc as well as neurology, & reviewing the  patient's electronic medical record.  Donna Christen Jamison Neighbor, M.D. [facility] Pulmonary & Critical Care Pager:  [phone] After 3pm or if no response, call [phone] 11/22/2015 9:05 AM

## 2015-11-22 NOTE — Progress Notes (Deleted)
Subjective: No Neurological changes since yesterday.   Objective: Current vital signs: BP 104/43 mmHg  Pulse 65  Temp(Src) 97.3 F (36.3 C) (Axillary)  Resp 16  Wt 87.9 kg (193 lb 12.6 oz)  SpO2 97% Vital signs in last 24 hours: Temp:  [96.2 F (35.7 C)-97.8 F (36.6 C)] 97.3 F (36.3 C) (03/26 1557) Pulse Rate:  [45-66] 65 (03/26 1600) Resp:  [16-21] 16 (03/26 1600) BP: (85-131)/(43-75) 104/43 mmHg (03/26 1600) SpO2:  [97 %-100 %] 97 % (03/26 1600) FiO2 (%):  [30 %-40 %] 40 % (03/26 1600) Weight:  [83.6 kg (184 lb 4.9 oz)-87.9 kg (193 lb 12.6 oz)] 87.9 kg (193 lb 12.6 oz) (03/26 0500)  Intake/Output from previous day: 03/25 0701 - 03/26 0700 In: 2230.1 [I.V.:1686.1; IV Piggyback:544] Out: -800 [Emesis/NG output:200] Intake/Output this shift: Total I/O In: 800.8 [I.V.:750.8; IV Piggyback:50] Out: -  Nutritional status: Diet NPO time specified  Neurologic Exam: Ment: Assessed while sedated with propofol and Versed gtt. Does not arouse to stimulation. No eye opening spontaneously or to stimuli. No attempts to communicate. Not following commands.  CN: No response to tactile stimulation. Pupils unreactive. No doll's eye reflex (on sedation). Face flaccidly symmetric. Motor/Sensory: No movement to noxious stimuli in any limb.   Lab Results: Basic Metabolic Panel:  Recent Labs Lab 11/19/15 0903 11/19/15 1227 11/19/15 1741 11/07/2015 0120 11/12/2015 1230 11/21/15 0600 11/21/15 0910 11/22/15 0530  NA 134*  --  131* 142 137 139  --  139  K 4.1  --  6.3* 4.9 5.0 4.7  --  4.4  CL 96*  --  92* 99* 97* 99*  --  100*  CO2 21*  --  21* _0 --  24  GLUCOSE 115*  --  180* 131* 155* 137*  --  139*  BUN 55*  --  69* 53* 65* 78*  --  43*  CREATININE 7.19*  --  9.76* 8.13* 9.21* 10.38*  --  7.08*  CALCIUM 8.0*  --  7.4* 7.6* 7.3* 7.3*  --  7.4*  MG  --   --   --  2.2 2.4  --   --  2.0  PHOS 6.6* 8.4*  --  7.1* 7.7*  --  7.7*  --     Liver Function Tests:  Recent  Labs Lab 11/19/15 0903 11/03/2015 0120 11/22/15 0530  AST 381* 99* 58*  ALT 232* 121* 56  ALKPHOS 278* 148* 103  BILITOT 1.0 1.1 0.8  PROT 6.0* 5.3* 5.1*  ALBUMIN 3.0* 2.6* 2.2*   No results for input(s): LIPASE, AMYLASE in the last 168 hours. No results for input(s): AMMONIA in the last 168 hours.  CBC:  Recent Labs Lab 11/19/15 1741 10/29/2015 0120 11/07/2015 0121 11/10/2015 1230 11/21/15 0600 11/22/15 0530  WBC 13.5* 14.2*  --  14.4* 13.7* 10.7*  NEUTROABS  --  9.7*  --   --   --  6.3  HGB 7.2* 6.4*  --  8.4* 8.3* 8.5*  HCT 22.0* 19.8*  --  26.1* 24.7* 25.2*  MCV 80.5 80.8  --  81.3 81.5 82.1  PLT 173 195 185 184 175 182    Cardiac Enzymes:  Recent Labs Lab 11/19/15 0250 11/05/2015 0120 11/04/2015 1230  TROPONINI 0.17* 0.50* 0.39*    Lipid Panel:  Recent Labs Lab 11/19/15 0903 11/15/2015 0220 11/22/15 0530  TRIG 166* 186* 472*    CBG:  Recent Labs Lab 11/22/15 0020 11/22/15 0355 11/22/15 0739 11/22/15 1157 11/22/15 1523  GLUCAP 107* 120* 60* 18 119*    Microbiology: Results for orders placed or performed during the hospital encounter of 11/19/15  MRSA PCR Screening     Status: None   Collection Time: 11/19/15  5:27 AM  Result Value Ref Range Status   MRSA by PCR NEGATIVE NEGATIVE Final    Comment:        The GeneXpert MRSA Assay (FDA approved for NASAL specimens only), is one component of a comprehensive MRSA colonization surveillance program. It is not intended to diagnose MRSA infection nor to guide or monitor treatment for MRSA infections.     Coagulation Studies:  Recent Labs  11/19/15 1741 11/19/15 2056 11/11/2015 0121 11/22/15 0530  LABPROT 19.9* 15.3* 15.4* 20.4*  INR 1.69 1.19 1.21 1.74*    Imaging: Dg Chest Port 1 View  11/21/2015  CLINICAL DATA:  45 year old male with a history of acute respiratory failure EXAM: PORTABLE CHEST 1 VIEW COMPARISON:  11/14/2015, 11/19/2015 FINDINGS: Cardiomediastinal silhouette unchanged with  cardiomegaly. Similar appearance of mixed interstitial and airspace opacities in the hilar regions. No pleural effusion or pneumothorax. Endotracheal tube is unchanged, terminating suitably above the carina, 3.6 cm. Gastric tube projects over the mediastinum, terminating out of the field of view. No displaced fracture. Interval removal of defibrillator pads. IMPRESSION: Similar aeration of the lungs, with mild mixed interstitial and airspace opacities in the hilar regions, potentially combination of atelectasis, edema, or consolidation. Cardiomegaly. Interval removal of the defibrillator pads. Signed, Dulcy Fanny. Earleen Newport, DO [facility] Electronically Signed   By: Corrie Mckusick D.O.   On: 11/21/2015 08:54    Medications:   Current facility-administered medications:  .  0.9 %  sodium chloride infusion, 250 mL, Intravenous, PRN, Corey Harold, NP, Last Rate: 10 mL/hr at 11/21/15 2122, 250 mL at 11/21/15 2122 .  0.9 %  sodium chloride infusion, , Intravenous, Once, Corey Harold, NP .  0.9 %  sodium chloride infusion, 100 mL, Intravenous, PRN, Roney Jaffe, MD .  0.9 %  sodium chloride infusion, 100 mL, Intravenous, PRN, Roney Jaffe, MD .  alteplase (CATHFLO ACTIVASE) injection 2 mg, 2 mg, Intracatheter, Once PRN, Roney Jaffe, MD .  antiseptic oral rinse solution (CORINZ), 7 mL, Mouth Rinse, 10 times per day, Corey Harold, NP, 7 mL at 11/22/15 1708 .  chlorhexidine gluconate (SAGE KIT) (PERIDEX) 0.12 % solution 15 mL, 15 mL, Mouth Rinse, BID, Corey Harold, NP, 15 mL at 11/22/15 0800 .  DOPamine (INTROPIN) 800 mg in dextrose 5 % 250 mL (3.2 mg/mL) infusion, 0-20 mcg/kg/min, Intravenous, Titrated, Javier Glazier, MD, Last Rate: 24.7 mL/hr at 11/22/15 1651, 15 mcg/kg/min at 11/22/15 1651 .  heparin injection 1,000 Units, 1,000 Units, Dialysis, PRN, Roney Jaffe, MD .  heparin injection 5,000 Units, 5,000 Units, Subcutaneous, 3 times  per day, Javier Glazier, MD, 5,000 Units at 11/22/15 0520 .  insulin aspart (novoLOG) injection 2-6 Units, 2-6 Units, Subcutaneous, 6 times per day, Corey Harold, NP, 2 Units at 11/22/15 0815 .  levETIRAcetam (KEPPRA) 1,500 mg in sodium chloride 0.9 % 100 mL IVPB, 1,500 mg, Intravenous, Q12H, Marliss Coots, PA-C, 1,500 mg at 11/22/15 1708 .  lidocaine (PF) (XYLOCAINE) 1 % injection 5 mL, 5 mL, Intradermal, PRN, Roney Jaffe, MD .  lidocaine-prilocaine (EMLA) cream 1 application, 1 application, Topical, PRN, Roney Jaffe, MD .  midazolam (VERSED) 100 mg in sodium chloride 0.9 % 100 mL (1 mg/mL) infusion, 32 mg/hr, Intravenous, Continuous,  Javier Glazier, MD, Last Rate: 32 mL/hr at 11/22/15 1700, 32 mg/hr at 11/22/15 1700 .  pantoprazole (PROTONIX) injection 40 mg, 40 mg, Intravenous, QHS, Corey Harold, NP, 40 mg at 11/22/15 0012 .  pentafluoroprop-tetrafluoroeth (GEBAUERS) aerosol 1 application, 1 application, Topical, PRN, Roney Jaffe, MD .  piperacillin-tazobactam (ZOSYN) IVPB 2.25 g, 2.25 g, Intravenous, Q8H, Veronda P Bryk, RPH, 2.25 g at 11/22/15 1500 .  propofol (DIPRIVAN) 1000 MG/100ML infusion, 0-80 mcg/kg/min, Intravenous, Continuous, Wallie Char, Last Rate: 36.9 mL/hr at 11/22/15 1700, 75 mcg/kg/min at 11/22/15 1700 .  valproate (DEPACON) 500 mg in dextrose 5 % 50 mL IVPB, 500 mg, Intravenous, 3 times per day, Kerney Elbe, MD, 500 mg at 11/22/15 6  Assessment 45 year old man with hypertension, diabetes mellitus and end-stage renal disease on dialysis, status post cardiac arrest with probable anoxic brain injury. Presentation is also complicated by seizure activity with status epilepticus. Prognosis cannot be determined while on sedation, but is likely poor. Status epilepticus has resolved with burst suppression protocol.   Recommendations: 1. When stable, obtain repeat CT scan of the head or MRI of the brain to evaluate for probable anoxic injury, including cerebral  edema 2. Continue bedside EEG and video monitoring  3. Continue Keppra at 1500 mg IV every 12 hours. 4. Continue scheduled Depakote at 5 mg/kg TID. 5. Continue propofol IV at 75 mcg/kg/min for seizure control. We are titrating based upon clinical seizure activity and EEG.  6. Continue Versed gtt at 32 mg/hr 7. Depakote level. 8. Monitor and correct calcium and other electrolytes, as indicated 9. Call neurology if there are acute changes.  10. Discussed poor prognosis with family.    Kerney Elbe, MD 11/22/2015, 5:24 PM

## 2015-11-22 NOTE — Progress Notes (Signed)
Subjective: No clinical seizure activity. Continues to be sedated and intubated.   Objective: Current vital signs: BP 95/50 mmHg  Pulse 49  Temp(Src) 97.8 F (36.6 C) (Axillary)  Resp 16  Wt 87.9 kg (193 lb 12.6 oz)  SpO2 100% Vital signs in last 24 hours: Temp:  [96.2 F (35.7 C)-97.8 F (36.6 C)] 97.8 F (36.6 C) (03/26 1203) Pulse Rate:  [45-50] 49 (03/26 1221) Resp:  [16-20] 16 (03/26 1221) BP: (71-131)/(46-75) 95/50 mmHg (03/26 1221) SpO2:  [98 %-100 %] 100 % (03/26 1221) FiO2 (%):  [30 %-40 %] 40 % (03/26 1221) Weight:  [83.6 kg (184 lb 4.9 oz)-87.9 kg (193 lb 12.6 oz)] 87.9 kg (193 lb 12.6 oz) (03/26 0500)  Intake/Output from previous day: 03/25 0701 - 03/26 0700 In: 2230.1 [I.V.:1686.1; IV Piggyback:544] Out: -800 [Emesis/NG output:200] Intake/Output this shift: Total I/O In: 356.3 [I.V.:356.3] Out: -  Nutritional status: Diet NPO time specified  Neurologic Exam: No change relative to prior examination: Ment: Assessed while sedated with propofol gtt. Does not arouse to stimulation. No eye opening spontaneously or to stimuli. No attempts to communicate. Not following commands.  CN: No response to tactile stimulation. Pupils unreactive. No doll's eye reflex (on sedation). Face flaccidly symmetric. Motor/Sensory: No movement to noxious stimuli in any limb.   Bedside EEG reveals mostly low voltage flat tracing with occasional small electrographic discharges.   Lab Results: Basic Metabolic Panel:  Recent Labs Lab 11/19/15 0903 11/19/15 1227 11/19/15 1741 11/19/2015 0120 11/25/2015 1230 11/21/15 0600 11/21/15 0910 11/22/15 0530  NA 134*  --  131* 142 137 139  --  139  K 4.1  --  6.3* 4.9 5.0 4.7  --  4.4  CL 96*  --  92* 99* 97* 99*  --  100*  CO2 21*  --  21* 28 24 24   --  24  GLUCOSE 115*  --  180* 131* 155* 137*  --  139*  BUN 55*  --  69* 53* 65* 78*  --  43*  CREATININE 7.19*  --  9.76* 8.13* 9.21* 10.38*  --  7.08*  CALCIUM 8.0*  --  7.4* 7.6* 7.3*  7.3*  --  7.4*  MG  --   --   --  2.2 2.4  --   --  2.0  PHOS 6.6* 8.4*  --  7.1* 7.7*  --  7.7*  --     Liver Function Tests:  Recent Labs Lab 11/19/15 0903 11/06/2015 0120 11/22/15 0530  AST 381* 99* 58*  ALT 232* 121* 56  ALKPHOS 278* 148* 103  BILITOT 1.0 1.1 0.8  PROT 6.0* 5.3* 5.1*  ALBUMIN 3.0* 2.6* 2.2*   No results for input(s): LIPASE, AMYLASE in the last 168 hours. No results for input(s): AMMONIA in the last 168 hours.  CBC:  Recent Labs Lab 11/19/15 1741 11/05/2015 0120 11/11/2015 0121 11/10/2015 1230 11/21/15 0600 11/22/15 0530  WBC 13.5* 14.2*  --  14.4* 13.7* 10.7*  NEUTROABS  --  9.7*  --   --   --  6.3  HGB 7.2* 6.4*  --  8.4* 8.3* 8.5*  HCT 22.0* 19.8*  --  26.1* 24.7* 25.2*  MCV 80.5 80.8  --  81.3 81.5 82.1  PLT 173 195 185 184 175 182    Cardiac Enzymes:  Recent Labs Lab 11/19/15 0250 11/13/2015 0120 11/25/2015 1230  TROPONINI 0.17* 0.50* 0.39*    Lipid Panel:  Recent Labs Lab 11/19/15 0903 11/21/2015 0220 11/22/15 0530  TRIG 166* 186* 472*    CBG:  Recent Labs Lab 11/21/15 1936 11/22/15 0020 11/22/15 0355 11/22/15 0739 11/22/15 1157  GLUCAP 116* 107* 120* 122* 93    Microbiology: Results for orders placed or performed during the hospital encounter of 11/19/15  MRSA PCR Screening     Status: None   Collection Time: 11/19/15  5:27 AM  Result Value Ref Range Status   MRSA by PCR NEGATIVE NEGATIVE Final    Comment:        The GeneXpert MRSA Assay (FDA approved for NASAL specimens only), is one component of a comprehensive MRSA colonization surveillance program. It is not intended to diagnose MRSA infection nor to guide or monitor treatment for MRSA infections.     Coagulation Studies:  Recent Labs  11/19/15 1741 11/19/15 2056 11/13/2015 0121 11/22/15 0530  LABPROT 19.9* 15.3* 15.4* 20.4*  INR 1.69 1.19 1.21 1.74*    Imaging: Dg Chest Port 1 View  11/21/2015  CLINICAL DATA:  45 year old male with a history of  acute respiratory failure EXAM: PORTABLE CHEST 1 VIEW COMPARISON:  11/05/2015, 11/19/2015 FINDINGS: Cardiomediastinal silhouette unchanged with cardiomegaly. Similar appearance of mixed interstitial and airspace opacities in the hilar regions. No pleural effusion or pneumothorax. Endotracheal tube is unchanged, terminating suitably above the carina, 3.6 cm. Gastric tube projects over the mediastinum, terminating out of the field of view. No displaced fracture. Interval removal of defibrillator pads. IMPRESSION: Similar aeration of the lungs, with mild mixed interstitial and airspace opacities in the hilar regions, potentially combination of atelectasis, edema, or consolidation. Cardiomegaly. Interval removal of the defibrillator pads. Signed, Dulcy Fanny. Earleen Newport, DO [facility] Electronically Signed   By: Corrie Mckusick D.O.   On: 11/21/2015 08:54    Medications:  Current facility-administered medications:  .  0.9 %  sodium chloride infusion, 250 mL, Intravenous, PRN, Corey Harold, NP, Last Rate: 10 mL/hr at 11/21/15 2122, 250 mL at 11/21/15 2122 .  0.9 %  sodium chloride infusion, , Intravenous, Once, Corey Harold, NP .  0.9 %  sodium chloride infusion, 100 mL, Intravenous, PRN, Roney Jaffe, MD .  0.9 %  sodium chloride infusion, 100 mL, Intravenous, PRN, Roney Jaffe, MD .  alteplase (CATHFLO ACTIVASE) injection 2 mg, 2 mg, Intracatheter, Once PRN, Roney Jaffe, MD .  antiseptic oral rinse solution (CORINZ), 7 mL, Mouth Rinse, 10 times per day, Corey Harold, NP, 7 mL at 11/22/15 1000 .  chlorhexidine gluconate (SAGE KIT) (PERIDEX) 0.12 % solution 15 mL, 15 mL, Mouth Rinse, BID, Corey Harold, NP, 15 mL at 11/22/15 0800 .  DOPamine (INTROPIN) 800 mg in dextrose 5 % 250 mL (3.2 mg/mL) infusion, 0-20 mcg/kg/min, Intravenous, Titrated, Javier Glazier, MD .  heparin injection 1,000 Units, 1,000 Units, Dialysis, PRN, Roney Jaffe,  MD .  heparin injection 5,000 Units, 5,000 Units, Subcutaneous, 3 times per day, Javier Glazier, MD, 5,000 Units at 11/22/15 0520 .  insulin aspart (novoLOG) injection 2-6 Units, 2-6 Units, Subcutaneous, 6 times per day, Corey Harold, NP, 2 Units at 11/22/15 0815 .  levETIRAcetam (KEPPRA) 1,500 mg in sodium chloride 0.9 % 100 mL IVPB, 1,500 mg, Intravenous, Q12H, Marliss Coots, PA-C, 1,500 mg at 11/22/15 0520 .  lidocaine (PF) (XYLOCAINE) 1 % injection 5 mL, 5 mL, Intradermal, PRN, Roney Jaffe, MD .  lidocaine-prilocaine (EMLA) cream 1 application, 1 application, Topical, PRN, Roney Jaffe, MD .  midazolam (VERSED) 100 mg in sodium chloride 0.9 %  100 mL (1 mg/mL) infusion, 32 mg/hr, Intravenous, Continuous, Javier Glazier, MD, Last Rate: 32 mL/hr at 11/22/15 1100, 32 mg/hr at 11/22/15 1100 .  pantoprazole (PROTONIX) injection 40 mg, 40 mg, Intravenous, QHS, Corey Harold, NP, 40 mg at 11/22/15 0012 .  pentafluoroprop-tetrafluoroeth (GEBAUERS) aerosol 1 application, 1 application, Topical, PRN, Roney Jaffe, MD .  piperacillin-tazobactam (ZOSYN) IVPB 2.25 g, 2.25 g, Intravenous, Q8H, Veronda P Bryk, RPH, 2.25 g at 11/22/15 0520 .  propofol (DIPRIVAN) 1000 MG/100ML infusion, 0-80 mcg/kg/min, Intravenous, Continuous, Wallie Char, Last Rate: 36.9 mL/hr at 11/22/15 1100, 75 mcg/kg/min at 11/22/15 1100 .  valproate (DEPACON) 500 mg in dextrose 5 % 50 mL IVPB, 500 mg, Intravenous, 3 times per day, Kerney Elbe, MD, 500 mg at 11/22/15 0768   Assessment 46 year old man with hypertension, diabetes mellitus and end-stage renal disease on dialysis, status post cardiac arrest with probable anoxic brain injury. Presentation is also complicated by seizure activity with status epilepticus. Prognosis cannot be determined while on sedation, but is likely poor.  Recommendations: 1. When stable, obtain repeat CT scan of the head or MRI of the brain to evaluate for possible anoxic injury, including  cerebral edema 2. Continue bedside EEG and video monitoring  3. Continue Keppra at 1500 mg IV every 12 hours. 4. Continue scheduled Depakote at 5 mg/kg TID. Obtain level.  5. Continue Versed gtt at current rate.  6. Consider lowering the rate of propofol infusion as triglyceride level has been increasing and is at 472 today, nearing the commonly accepted threshold of 500 mg/dL, at which risk of pancreatitis would be significantly increased. If propofol infusion rate is reduced, call neurology to reevaluate EEG 1 hour afterwards to determine if Versed should be increased.   Kerney Elbe, MD 11/22/2015, 12:24 PM

## 2015-11-22 NOTE — Progress Notes (Signed)
[facility] Progress Note   Subjective: dialyzed last night, 1L removed, BP supported by pressors now on neo gtt. Bradycardic. Getting lots of sedation for seizure suppression (IV versed and diprivan in addition to IV keppra and depakote).   Filed Vitals:   11/22/15 1200 11/22/15 1203 11/22/15 1221 11/22/15 1230  BP: 96/50  95/50 97/50  Pulse: 48  49 49  Temp:  97.8 F (36.6 C)    TempSrc:  Axillary    Resp: 16  16 16   Weight:      SpO2: 100%  100% 99%    Inpatient medications: . sodium chloride   Intravenous Once  . antiseptic oral rinse  7 mL Mouth Rinse 10 times per day  . chlorhexidine gluconate (SAGE KIT)  15 mL Mouth Rinse BID  . heparin subcutaneous  5,000 Units Subcutaneous 3 times per day  . insulin aspart  2-6 Units Subcutaneous 6 times per day  . levETIRAcetam  1,500 mg Intravenous Q12H  . pantoprazole (PROTONIX) IV  40 mg Intravenous QHS  . piperacillin-tazobactam (ZOSYN)  IV  2.25 g Intravenous Q8H  . valproate sodium  500 mg Intravenous 3 times per day   . DOPamine 5 mcg/kg/min (11/22/15 1309)  . midazolam (VERSED) infusion 32 mg/hr (11/22/15 1309)  . propofol (DIPRIVAN) infusion 75 mcg/kg/min (11/22/15 1309)   sodium chloride, sodium chloride, sodium chloride, alteplase, heparin, lidocaine (PF), lidocaine-prilocaine, pentafluoroprop-tetrafluoroeth  Exam: Intubated, sedated on the vent, NG in place Chest clear bilat RRR no MRG Abd soft ntnd no mass or ascites +bs GU normal male defer MS no joint effusions or deformity Ext no edema / wounds/ ulcers Left forearm AVF +bruit w oozing into bandages Neuro is unresponsive on vent  CXR 3/25 - mostly clear, no edema  Dialysis: TTS [facility]  Assessment: 1 Cardiac arrest / asystole 3/23 2 VDRF 3 Seizures - continuous, on heavy sedation for EEG suppression 4 Bradycardia - switching to dopamine for pressor 5 Bleeding L AVF - have called [facility]VS to eval. No anticoagulants 6 ESRD HD TTS in  [place] 7 HTN was on norvasc/ losartan at home ; BP's low here on pressors 8 Volume - no gross vol excess 9 Seizures - on IV AED's + continuous drip sedation w diprivan/ versed   Plan - poor prognosis. Next HD Tuesday.     Kelly Splinter MD [facility] pager [phone]    cell [phone] 11/22/2015, 1:29 PM    Recent Labs Lab 11/18/2015 0120 11/18/2015 1230 11/21/15 0600 11/21/15 0910 11/22/15 0530  NA 142 137 139  --  139  K 4.9 5.0 4.7  --  4.4  CL 99* 97* 99*  --  100*  CO2 28 24 24   --  24  GLUCOSE 131* 155* 137*  --  139*  BUN 53* 65* 78*  --  43*  CREATININE 8.13* 9.21* 10.38*  --  7.08*  CALCIUM 7.6* 7.3* 7.3*  --  7.4*  PHOS 7.1* 7.7*  --  7.7*  --     Recent Labs Lab 11/19/15 0903 11/14/2015 0120 11/22/15 0530  AST 381* 99* 58*  ALT 232* 121* 56  ALKPHOS 278* 148* 103  BILITOT 1.0 1.1 0.8  PROT 6.0* 5.3* 5.1*  ALBUMIN 3.0* 2.6* 2.2*    Recent Labs Lab 11/21/2015 0120  11/14/2015 1230 11/21/15 0600 11/22/15 0530  WBC 14.2*  --  14.4* 13.7* 10.7*  NEUTROABS 9.7*  --   --   --  6.3  HGB 6.4*  --  8.4* 8.3* 8.5*  HCT 19.8*  --  26.1* 24.7* 25.2*  MCV 80.8  --  81.3 81.5 82.1  PLT 195  < > 184 175 182  < > = values in this interval not displayed.

## 2015-11-22 NOTE — Op Note (Signed)
Procedure: Repair left arm AV fistula Preop: bleeding AVF Post op: same  Details: sterile prep left forearm.  Single figure of 8 stitch placed at bleeding needlehole site mid forearm, hemostasis obtained  Fabienne Brunsharles Arrin Pintor, MD [facility] [place] Office: [phone] Pager: [phone]

## 2015-11-23 DIAGNOSIS — G931 Anoxic brain damage, not elsewhere classified: Secondary | ICD-10-CM

## 2015-11-23 DIAGNOSIS — I6782 Cerebral ischemia: Secondary | ICD-10-CM

## 2015-11-23 LAB — COMPREHENSIVE METABOLIC PANEL
ALK PHOS: 120 U/L (ref 38–126)
ALT: 42 U/L (ref 17–63)
AST: 62 U/L — ABNORMAL HIGH (ref 15–41)
Albumin: 2.2 g/dL — ABNORMAL LOW (ref 3.5–5.0)
Anion gap: 21 — ABNORMAL HIGH (ref 5–15)
BUN: 48 mg/dL — ABNORMAL HIGH (ref 6–20)
CALCIUM: 7 mg/dL — AB (ref 8.9–10.3)
CHLORIDE: 96 mmol/L — AB (ref 101–111)
CO2: 20 mmol/L — ABNORMAL LOW (ref 22–32)
CREATININE: 8.74 mg/dL — AB (ref 0.61–1.24)
GFR, EST AFRICAN AMERICAN: 7 mL/min — AB (ref >60)
GFR, EST NON AFRICAN AMERICAN: 6 mL/min — AB (ref >60)
Glucose, Bld: 140 mg/dL — ABNORMAL HIGH (ref 65–99)
Potassium: 4.7 mmol/L (ref 3.5–5.1)
Sodium: 137 mmol/L (ref 135–145)
Total Bilirubin: 0.9 mg/dL (ref 0.3–1.2)
Total Protein: 5.3 g/dL — ABNORMAL LOW (ref 6.5–8.1)

## 2015-11-23 LAB — CBC WITH DIFFERENTIAL/PLATELET
Basophils Absolute: 0.1 10*3/uL (ref 0.0–0.1)
Basophils Relative: 1 %
EOS PCT: 13 %
Eosinophils Absolute: 1.5 10*3/uL — ABNORMAL HIGH (ref 0.0–0.7)
HCT: 26.1 % — ABNORMAL LOW (ref 39.0–52.0)
Hemoglobin: 8.5 g/dL — ABNORMAL LOW (ref 13.0–17.0)
LYMPHS ABS: 1.1 10*3/uL (ref 0.7–4.0)
Lymphocytes Relative: 10 %
MCH: 26.2 pg (ref 26.0–34.0)
MCHC: 32.6 g/dL (ref 30.0–36.0)
MCV: 80.6 fL (ref 78.0–100.0)
MONO ABS: 1.9 10*3/uL — AB (ref 0.1–1.0)
MONOS PCT: 16 %
NEUTROS PCT: 60 %
Neutro Abs: 7.1 10*3/uL (ref 1.7–7.7)
PLATELETS: 181 10*3/uL (ref 150–400)
RBC: 3.24 MIL/uL — AB (ref 4.22–5.81)
RDW: 15.4 % (ref 11.5–15.5)
WBC: 11.7 10*3/uL — AB (ref 4.0–10.5)

## 2015-11-23 LAB — GLUCOSE, CAPILLARY
GLUCOSE-CAPILLARY: 121 mg/dL — AB (ref 65–99)
Glucose-Capillary: 145 mg/dL — ABNORMAL HIGH (ref 65–99)
Glucose-Capillary: 108 mg/dL — ABNORMAL HIGH (ref 65–99)
Glucose-Capillary: 155 mg/dL — ABNORMAL HIGH (ref 65–99)
Glucose-Capillary: 108 mg/dL — ABNORMAL HIGH (ref 65–99)

## 2015-11-23 LAB — VALPROIC ACID LEVEL: VALPROIC ACID LVL: 38 ug/mL — AB (ref 50.0–100.0)

## 2015-11-23 LAB — TRIGLYCERIDES: Triglycerides: 472 mg/dL — ABNORMAL HIGH (ref <150)

## 2015-11-23 LAB — MAGNESIUM: MAGNESIUM: 1.7 mg/dL (ref 1.7–2.4)

## 2015-11-23 LAB — APTT: aPTT: 53 seconds — ABNORMAL HIGH (ref 24–37)

## 2015-11-23 LAB — PROTIME-INR
INR: 2.44 — AB (ref 0.00–1.49)
PROTHROMBIN TIME: 26.2 s — AB (ref 11.6–15.2)

## 2015-11-23 MED ORDER — SODIUM CHLORIDE 0.9 % IV BOLUS (SEPSIS)
500.0000 mL | Freq: Once | INTRAVENOUS | Status: AC
  Administered 2015-11-23: 500 mL via INTRAVENOUS

## 2015-11-23 MED ORDER — SODIUM CHLORIDE 0.9 % IV SOLN
1000.0000 mg | INTRAVENOUS | Status: DC
  Filled 2015-11-23: qty 10

## 2015-11-23 MED ORDER — NOREPINEPHRINE BITARTRATE 1 MG/ML IV SOLN
0.0000 ug/min | INTRAVENOUS | Status: DC
  Administered 2015-11-23: 5 ug/min via INTRAVENOUS
  Filled 2015-11-23: qty 16

## 2015-11-23 MED ORDER — SODIUM CHLORIDE 0.9 % IV SOLN: 500.0000 mg | INTRAVENOUS | Status: DC

## 2015-11-24 MED FILL — Medication: Qty: 1 | Status: AC

## 2015-11-27 ENCOUNTER — Telehealth: Payer: Self-pay

## 2015-11-27 NOTE — Telephone Encounter (Signed)
On 11/27/2015 I received a death certificate from [employer] (original). The death certificate is for burial. The patient is a patient of Doctor Vassie LollAlva. The death certificate will be taken to [facility] Monday pm for signature. On 12/01/2015 I received the death certificate back from Doctor Vassie LollAlva. I got the death certificate ready and called the funeral home to let them know the death certificate went out [facility] today per their request.

## 2015-11-28 NOTE — Progress Notes (Signed)
Interval History:                                                                                                                      S[facility] Lopez is an 45 y.o. male patient with  Prolonged Status Epilepticus. Currently on 17 Mg/HR and Propofol. No clinical seizure noted.    Past Medical History: Past Medical History  Diagnosis Date  . Diabetes mellitus without complication (HCC)   . Hypertension   . Renal disorder   . Cardiac arrest (HCC)     11/19/2014  . Diabetes (HCC)     No past surgical history on file.  Family History: Family History  Problem Relation Age of Onset  . Family history unknown: Yes    Social History:   has no tobacco, alcohol, and drug history on file.  Allergies:  No Known Allergies   Medications:                                                                                                                         Current facility-administered medications:  .  0.9 %  sodium chloride infusion, 250 mL, Intravenous, PRN, Paul W Hoffman, NP, Last Rate: 10 mL/hr at 11/06/2015 0900, 250 mL at 11/22/2015 0900 .  0.9 %  sodium chloride infusion, , Intravenous, Once, Paul W Hoffman, NP .  0.9 %  sodium chloride infusion, 100 mL, Intravenous, PRN, Robert Schertz, MD .  0.9 %  sodium chloride infusion, 100 mL, Intravenous, PRN, Robert Schertz, MD .  alteplase (CATHFLO ACTIVASE) injection 2 mg, 2 mg, Intracatheter, Once PRN, Robert Schertz, MD .  antiseptic oral rinse solution (CORINZ), 7 mL, Mouth Rinse, 10 times per day, Paul W Hoffman, NP, 7 mL at 11/19/2015 0552 .  chlorhexidine gluconate (SAGE KIT) (PERIDEX) 0.12 % solution 15 mL, 15 mL, Mouth Rinse, BID, Paul W Hoffman, NP, 15 mL at 10/29/2015 0823 .  DOPamine (INTROPIN) 800 mg in dextrose 5 % 250 mL (3.2 mg/mL) infusion, 0-20 mcg/kg/min, Intravenous, Titrated, Jennings E Nestor, MD, Last Rate: 33 mL/hr at 11/24/2015 0900, 20 mcg/kg/min at 11/25/2015 0900 .  heparin injection 1,000 Units, 1,000 Units, Dialysis, PRN,  Robert Schertz, MD .  heparin injection 5,000 Units, 5,000 Units, Subcutaneous, 3 times per day, Jennings E Nestor, MD, 5,000 Units at 11/24/2015 0551 .  insulin aspart (novoLOG) injection 2-6 Units, 2-6 Units, Subcutaneous, 6 times per day, Paul W Hoffman, NP, 4 Units at 11/18/2015 0824 .  levETIRAcetam (KEPPRA) 1,500   mg in sodium chloride 0.9 % 100 mL IVPB, 1,500 mg, Intravenous, Q12H, David R Smith, PA-C, 1,500 mg at 11/09/2015 0500 .  lidocaine (PF) (XYLOCAINE) 1 % injection 5 mL, 5 mL, Intradermal, PRN, Robert Schertz, MD .  lidocaine-prilocaine (EMLA) cream 1 application, 1 application, Topical, PRN, Robert Schertz, MD .  midazolam (VERSED) 100 mg in sodium chloride 0.9 % 100 mL (1 mg/mL) infusion, 32 mg/hr, Intravenous, Continuous, Jennings E Nestor, MD, Last Rate: 17 mL/hr at 11/04/2015 0930, 17 mg/hr at 11/27/2015 0930 .  pantoprazole (PROTONIX) injection 40 mg, 40 mg, Intravenous, QHS, Paul W Hoffman, NP, 40 mg at 11/22/15 2250 .  pentafluoroprop-tetrafluoroeth (GEBAUERS) aerosol 1 application, 1 application, Topical, PRN, Robert Schertz, MD .  piperacillin-tazobactam (ZOSYN) IVPB 2.25 g, 2.25 g, Intravenous, Q8H, Veronda P Bryk, RPH, 2.25 g at 11/08/2015 0550 .  valproate (DEPACON) 500 mg in dextrose 5 % 50 mL IVPB, 500 mg, Intravenous, 3 times per day, Eric Lindzen, MD, 500 mg at 11/18/2015 0551   Neurologic Examination:                                                                                                     Today's Vitals   11/13/2015 0815 11/06/2015 0830 10/31/2015 0900 11/27/2015 0930  BP: 100/67 98/54 82/38 88/44  Pulse: 83 79 84 67  Temp:      TempSrc:      Resp: 18 16 16 17  Weight:      SpO2: 98% 96% 96% 96%    Mental Status: Patient does not respond to verbal stimuli.  Does not respond to deep sternal rub.  Does not follow commands.  No verbalizations noted. Not breathing over the Vent.  Cranial Nerves: II: patient does not respond confrontation bilaterally, pupils right 4 mm,  left 4 mm,and non-reactive bilaterally III,IV,VI: doll's response absent bilaterally.  V,VII: corneal reflex absent bilaterally  VIII: patient does not respond to verbal stimuli IX,X: gag reflex absent, XI: trapezius strength unable to test bilaterally XII: tongue strength unable to test Motor: Extremities flaccid throughout.  No spontaneous movement noted.  No purposeful movements noted. Sensory: Does not respond to noxious stimuli in any extremity. Deep Tendon Reflexes:  Absent throughout. Plantars: absent bilaterally Cerebellar: Unable to perform     Lab Results: Basic Metabolic Panel:  Recent Labs Lab 11/19/15 0903 11/19/15 1227  10/31/2015 0120 10/28/2015 1230 11/21/15 0600 11/21/15 0910 11/22/15 0530 11/26/2015 0440  NA 134*  --   < > 142 137 139  --  139 137  K 4.1  --   < > 4.9 5.0 4.7  --  4.4 4.7  CL 96*  --   < > 99* 97* 99*  --  100* 96*  CO2 21*  --   < > 28 24 24  --  24 20*  GLUCOSE 115*  --   < > 131* 155* 137*  --  139* 140*  BUN 55*  --   < > 53* 65* 78*  --  43* 48*  CREATININE 7.19*  --   < > 8.13* 9.21*   10.38*  --  7.08* 8.74*  CALCIUM 8.0*  --   < > 7.6* 7.3* 7.3*  --  7.4* 7.0*  MG  --   --   --  2.2 2.4  --   --  2.0 1.7  PHOS 6.6* 8.4*  --  7.1* 7.7*  --  7.7*  --   --   < > = values in this interval not displayed.  Liver Function Tests:  Recent Labs Lab 11/19/15 0903 11/27/2015 0120 11/22/15 0530 December 04, 2015 0440  AST 381* 99* 58* 62*  ALT 232* 121* 56 42  ALKPHOS 278* 148* 103 120  BILITOT 1.0 1.1 0.8 0.9  PROT 6.0* 5.3* 5.1* 5.3*  ALBUMIN 3.0* 2.6* 2.2* 2.2*   No results for input(s): LIPASE, AMYLASE in the last 168 hours. No results for input(s): AMMONIA in the last 168 hours.  CBC:  Recent Labs Lab 11/04/2015 0120 11/14/2015 0121 11/19/2015 1230 11/21/15 0600 11/22/15 0530 2015/12/04 0440  WBC 14.2*  --  14.4* 13.7* 10.7* 11.7*  NEUTROABS 9.7*  --   --   --  6.3 7.1  HGB 6.4*  --  8.4* 8.3* 8.5* 8.5*  HCT 19.8*  --  26.1* 24.7*  25.2* 26.1*  MCV 80.8  --  81.3 81.5 82.1 80.6  PLT 195 185 184 175 182 181    Cardiac Enzymes:  Recent Labs Lab 11/19/15 0250 11/02/2015 0120 11/22/2015 1230  TROPONINI 0.17* 0.50* 0.39*    Lipid Panel:  Recent Labs Lab 11/19/15 0903 11/06/2015 0220 11/22/15 0530 12-04-15 0440  TRIG 166* 186* 472* 472*    CBG:  Recent Labs Lab 11/22/15 1523 11/22/15 2040 11/22/15 2350 Dec 04, 2015 0332 Dec 04, 2015 0748  GLUCAP 119* 165* 120* 108* 155*    Microbiology: Results for orders placed or performed during the hospital encounter of 11/19/15  MRSA PCR Screening     Status: None   Collection Time: 11/19/15  5:27 AM  Result Value Ref Range Status   MRSA by PCR NEGATIVE NEGATIVE Final    Comment:        The GeneXpert MRSA Assay (FDA approved for NASAL specimens only), is one component of a comprehensive MRSA colonization surveillance program. It is not intended to diagnose MRSA infection nor to guide or monitor treatment for MRSA infections.     Imaging: Ct Head Wo Contrast  11/19/2015  CLINICAL DATA:  Post cardiac arrest.  Intubated.  Unresponsive. EXAM: CT HEAD WITHOUT CONTRAST TECHNIQUE: Contiguous axial images were obtained from the base of the skull through the vertex without intravenous contrast. COMPARISON:  None FINDINGS: Ventricle size is normal. Negative for acute or chronic infarction. Negative for hemorrhage or fluid collection. Negative for mass or edema. No shift of the midline structures. Calvarium is intact. Mucosal edema in the paranasal sinuses.  Air-fluid levels.  NG tube. IMPRESSION: Normal CT of the brain Sinusitis Electronically Signed   By: Franchot Gallo M.D.   On: 11/19/2015 18:26   Dg Chest Port 1 View  11/21/2015  CLINICAL DATA:  45 year old male with a history of acute respiratory failure EXAM: PORTABLE CHEST 1 VIEW COMPARISON:  11/19/2015, 11/19/2015 FINDINGS: Cardiomediastinal silhouette unchanged with cardiomegaly. Similar appearance of mixed  interstitial and airspace opacities in the hilar regions. No pleural effusion or pneumothorax. Endotracheal tube is unchanged, terminating suitably above the carina, 3.6 cm. Gastric tube projects over the mediastinum, terminating out of the field of view. No displaced fracture. Interval removal of defibrillator pads. IMPRESSION: Similar aeration of the lungs, with mild  mixed interstitial and airspace opacities in the hilar regions, potentially combination of atelectasis, edema, or consolidation. Cardiomegaly. Interval removal of the defibrillator pads. Signed, Dulcy Fanny. Earleen Newport, DO [facility] Electronically Signed   By: Corrie Mckusick D.O.   On: 11/21/2015 08:54   Dg Chest Port 1 View  11/06/2015  CLINICAL DATA:  Endotracheal tube post trans port. EXAM: PORTABLE CHEST 1 VIEW COMPARISON:  11/19/2015 FINDINGS: Endotracheal tube tip is low, measuring 1.5 cm above carina. Shallow inspiration with atelectasis or infiltration in the right lung base. No pneumothorax. Mild cardiac enlargement. Calcification of the aorta. Nodular opacity seen previously in the right upper lung are less prominent. IMPRESSION: Endotracheal tube tip measures 1.5 cm above the carina. Shallow inspiration with infiltration or atelectasis in the right lung base. Cardiac enlargement. Electronically Signed   By: Lucienne Capers M.D.   On: 10/28/2015 01:30    Assessment and plan:   Antoneo Ghrist is an 45 y.o. male patient with prolonged Status epilepticus. Currently on LTM with Versed drip of 17 mg/HR and Propofol. Dr. Silverio Decamp to talk with family and see later today. prognosis at this time is poor.   To be seen by Dr. Silverio Decamp. Please see his attestation note for A/P for any additional work up recommendations.

## 2015-11-28 NOTE — Progress Notes (Signed)
CCM notified that patient is in ventricular rhythm with frequent PVCs at a rate from 52-110 bpm. Will continue to monitor patient.

## 2015-11-28 NOTE — Progress Notes (Signed)
Subjective: Interval History: nonresponsive,no change  Objective: Vital signs in last 24 hours: Temp:  [97.3 F (36.3 C)-100.9 F (38.3 C)] 100.9 F (38.3 C) (03/27 1143) Pulse Rate:  [31-102] 59 (03/27 1130) Resp:  [16-23] 17 (03/27 1130) BP: (82-127)/(38-67) 87/43 mmHg (03/27 1130) SpO2:  [93 %-100 %] 96 % (03/27 1130) FiO2 (%):  [30 %-40 %] 30 % (03/27 0730) Weight:  [87.2 kg (192 lb 3.9 oz)] 87.2 kg (192 lb 3.9 oz) (03/27 0500) Weight change: 3.6 kg (7 lb 15 oz)  Intake/Output from previous day: 03/26 0701 - 03/27 0700 In: 2761.6 [I.V.:2216.6; IV Piggyback:545] Out: 125 [Urine:125] Intake/Output this shift: Total I/O In: 150 [I.V.:150] Out: -   General appearance: nonresponsive, on vent Resp: rhonchi bilaterally Cardio: S1, S2 normal and systolic murmur: holosystolic 2/6, blowing at apex GI: soft, non-tender; bowel sounds normal; no masses,  no organomegaly Extremities: AVF LLA, soft bruit  Lab Results:  Recent Labs  11/22/15 0530 11/27/2015 0440  WBC 10.7* 11.7*  HGB 8.5* 8.5*  HCT 25.2* 26.1*  PLT 182 181   BMET:  Recent Labs  11/22/15 0530 10/30/2015 0440  NA 139 137  K 4.4 4.7  CL 100* 96*  CO2 24 20*  GLUCOSE 139* 140*  BUN 43* 48*  CREATININE 7.08* 8.74*  CALCIUM 7.4* 7.0*   No results for input(s): PTH in the last 72 hours. Iron Studies: No results for input(s): IRON, TIBC, TRANSFERRIN, FERRITIN in the last 72 hours.  Studies/Results: No results found.  I have reviewed the patient's current medications.  Assessment/Plan: 1 ESRD HD TTS, chem ok. Will do HD if to cont in am 2 Anemia 3 HPTH not addressed 4 Arrest, nonresponsive, plan per neuro 5 Sz adjust keppra 6 DM controlled P Neuro input, poss HD, control DM , adjust keppra    LOS: 3 days   [facility] L 11/14/2015,11:52 AM

## 2015-11-28 NOTE — Progress Notes (Signed)
Interval History:                                                                                                                      Spencer Lopez is an 45 y.o. 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  PMH  includes ESRD on HD, DM, HTN. He was initially admitted to [facility] 3/23 AM s/p cardiac arrest at home Patient had reportedly missed dialysis session(s). He was found down without pulse by brother who started CPR at scene and called EMS. ROSC was achieved by EMS within about 10 mins of the patient being found down. In ED he was in complete heart block, likely secondary to potassium >7.5. He was also hypoglycemic (20). He was intubated in ED. Dialysis catheter was placed and he was started on HD after temporizing measures (Ca++, insulin). He was not started on induced hypothermia due to temp 30C on admit. Hosptial course complicated by coagulapathy (improved with [place]), Status epilepticus Patient transferred to ICU for PCCM admission  He is in ICU intubated, sedated with Versed infusion, on seizure medications Keppra and Depakote. His EEG showed burst suppression with intermittent periodic Left frontal discharges with burst activity.   Past Medical History: Past Medical History  Diagnosis Date  . Diabetes mellitus without complication ([place])   . Hypertension   . Renal disorder   . Cardiac arrest ([place])     11/19/2014  . Diabetes ([place])     No past surgical history on file.  Family History: Family History  Problem Relation Age of Onset  . Family history unknown: Yes    Social History:   has no tobacco, alcohol, and drug history on file.  Allergies:  No Known Allergies   Medications:                                                                                                                         Current facility-administered medications:  .  0.9 %  sodium chloride infusion, 250 mL, Intravenous, PRN, Corey Harold, NP, Last Rate: 10 mL/hr at  11-25-2015 0900, 250 mL at 11/25/15 0900 .  0.9 %  sodium chloride infusion, , Intravenous, Once, Corey Harold, NP .  0.9 %  sodium chloride infusion, 100 mL, Intravenous, PRN, Roney Jaffe, MD .  0.9 %  sodium chloride infusion, 100 mL, Intravenous, PRN, Roney Jaffe, MD .  alteplase (CATHFLO ACTIVASE) injection 2 mg, 2 mg, Intracatheter, Once PRN, Herbie Baltimore  Schertz, MD .  antiseptic oral rinse solution (CORINZ), 7 mL, Mouth Rinse, 10 times per day, Corey Harold, NP, 7 mL at Dec 22, 2015 1710 .  chlorhexidine gluconate (SAGE KIT) (PERIDEX) 0.12 % solution 15 mL, 15 mL, Mouth Rinse, BID, Corey Harold, NP, 15 mL at 2015-12-22 0823 .  DOPamine (INTROPIN) 800 mg in dextrose 5 % 250 mL (3.2 mg/mL) infusion, 0-20 mcg/kg/min, Intravenous, Titrated, Javier Glazier, MD, Last Rate: 33 mL/hr at Dec 22, 2015 1659, 20 mcg/kg/min at 12/22/15 1659 .  heparin injection 1,000 Units, 1,000 Units, Dialysis, PRN, Roney Jaffe, MD .  heparin injection 5,000 Units, 5,000 Units, Subcutaneous, 3 times per day, Javier Glazier, MD, 5,000 Units at 12-22-15 0551 .  insulin aspart (novoLOG) injection 2-6 Units, 2-6 Units, Subcutaneous, 6 times per day, Corey Harold, NP, 2 Units at 12-22-15 1142 .  [START ON 10/28/2015] levETIRAcetam (KEPPRA) 1,000 mg in sodium chloride 0.9 % 100 mL IVPB, 1,000 mg, Intravenous, Q24H, Mauricia Area, MD .  Derrill Memo ON 11/16/2015] levETIRAcetam (KEPPRA) 500 mg in sodium chloride 0.9 % 100 mL IVPB, 500 mg, Intravenous, Q T,Th,Sat-1800, Cristina E Reyes, RPH .  lidocaine (PF) (XYLOCAINE) 1 % injection 5 mL, 5 mL, Intradermal, PRN, Roney Jaffe, MD .  lidocaine-prilocaine (EMLA) cream 1 application, 1 application, Topical, PRN, Roney Jaffe, MD .  midazolam (VERSED) 100 mg in sodium chloride 0.9 % 100 mL (1 mg/mL) infusion, 32 mg/hr, Intravenous, Continuous, Javier Glazier, MD, Stopped at 12/22/2015 1202 .  norepinephrine (LEVOPHED) 16 mg in dextrose 5 % 250 mL (0.064 mg/mL) infusion, 0-20  mcg/min, Intravenous, Titrated, Kara Mead V, MD, Last Rate: 7.5 mL/hr at 12/22/2015 1705, 8 mcg/min at 2015-12-22 1705 .  pantoprazole (PROTONIX) injection 40 mg, 40 mg, Intravenous, QHS, Corey Harold, NP, 40 mg at 11/22/15 2250 .  pentafluoroprop-tetrafluoroeth (GEBAUERS) aerosol 1 application, 1 application, Topical, PRN, Roney Jaffe, MD .  piperacillin-tazobactam (ZOSYN) IVPB 2.25 g, 2.25 g, Intravenous, Q8H, Veronda P Bryk, RPH, 2.25 g at 12-22-15 1658 .  valproate (DEPACON) 500 mg in dextrose 5 % 50 mL IVPB, 500 mg, Intravenous, 3 times per day, Kerney Elbe, MD, 500 mg at 12/22/2015 1654   Neurologic Examination:                                                                                                     Today's Vitals   12/22/15 1635 12-22-15 1645 12/22/15 1659 12/22/2015 1700  BP: 88/55 87/51 87/51  86/57  Pulse: 92 91 90 88  Temp:      TempSrc:      Resp: 16 16 16 16   Weight:      SpO2: 98% 99% 98% 98%    Intubated, sedated, pupils 5 mm, nonreactive. No motor response to stimulation.    Lab Results: Basic Metabolic Panel:  Recent Labs Lab 11/19/15 0903 11/19/15 1227  11/26/2015 0120 11/08/2015 1230 11/21/15 0600 11/21/15 0910 11/22/15 0530 2015/12/22 0440  NA 134*  --   < > 142 137 139  --  139 137  K 4.1  --   < >  4.9 5.0 4.7  --  4.4 4.7  CL 96*  --   < > 99* 97* 99*  --  100* 96*  CO2 21*  --   < > 28 24 24   --  24 20*  GLUCOSE 115*  --   < > 131* 155* 137*  --  139* 140*  BUN 55*  --   < > 53* 65* 78*  --  43* 48*  CREATININE 7.19*  --   < > 8.13* 9.21* 10.38*  --  7.08* 8.74*  CALCIUM 8.0*  --   < > 7.6* 7.3* 7.3*  --  7.4* 7.0*  MG  --   --   --  2.2 2.4  --   --  2.0 1.7  PHOS 6.6* 8.4*  --  7.1* 7.7*  --  7.7*  --   --   < > = values in this interval not displayed.  Liver Function Tests:  Recent Labs Lab 11/19/15 0903 11/22/2015 0120 11/22/15 0530 12-09-2015 0440  AST 381* 99* 58* 62*  ALT 232* 121* 56 42  ALKPHOS 278* 148* 103 120  BILITOT 1.0  1.1 0.8 0.9  PROT 6.0* 5.3* 5.1* 5.3*  ALBUMIN 3.0* 2.6* 2.2* 2.2*   No results for input(s): LIPASE, AMYLASE in the last 168 hours. No results for input(s): AMMONIA in the last 168 hours.  CBC:  Recent Labs Lab 11/25/2015 0120 11/04/2015 0121 11/15/2015 1230 11/21/15 0600 11/22/15 0530 12-09-2015 0440  WBC 14.2*  --  14.4* 13.7* 10.7* 11.7*  NEUTROABS 9.7*  --   --   --  6.3 7.1  HGB 6.4*  --  8.4* 8.3* 8.5* 8.5*  HCT 19.8*  --  26.1* 24.7* 25.2* 26.1*  MCV 80.8  --  81.3 81.5 82.1 80.6  PLT 195 185 184 175 182 181    Cardiac Enzymes:  Recent Labs Lab 11/19/15 0250 11/17/2015 0120 11/19/2015 1230  TROPONINI 0.17* 0.50* 0.39*    Lipid Panel:  Recent Labs Lab 11/19/15 0903 10/28/2015 0220 11/22/15 0530 2015/12/09 0440  TRIG 166* 186* 472* 472*    CBG:  Recent Labs Lab 11/22/15 2350 2015-12-09 0332 Dec 09, 2015 0748 12-09-2015 1142 December 09, 2015 1618  GLUCAP 120* 108* 155* 121* 108*    Microbiology: Results for orders placed or performed during the hospital encounter of 11/19/15  MRSA PCR Screening     Status: None   Collection Time: 11/19/15  5:27 AM  Result Value Ref Range Status   MRSA by PCR NEGATIVE NEGATIVE Final    Comment:        The GeneXpert MRSA Assay (FDA approved for NASAL specimens only), is one component of a comprehensive MRSA colonization surveillance program. It is not intended to diagnose MRSA infection nor to guide or monitor treatment for MRSA infections.     Imaging: Dg Chest Port 1 View  11/21/2015  CLINICAL DATA:  45 year old male with a history of acute respiratory failure EXAM: PORTABLE CHEST 1 VIEW COMPARISON:  11/19/2015, 11/19/2015 FINDINGS: Cardiomediastinal silhouette unchanged with cardiomegaly. Similar appearance of mixed interstitial and airspace opacities in the hilar regions. No pleural effusion or pneumothorax. Endotracheal tube is unchanged, terminating suitably above the carina, 3.6 cm. Gastric tube projects over the mediastinum,  terminating out of the field of view. No displaced fracture. Interval removal of defibrillator pads. IMPRESSION: Similar aeration of the lungs, with mild mixed interstitial and airspace opacities in the hilar regions, potentially combination of atelectasis, edema, or consolidation. Cardiomegaly. Interval removal of the defibrillator  pads. Signed, Dulcy Fanny. Earleen Newport, DO [facility] Electronically Signed   By: Corrie Mckusick D.O.   On: 11/21/2015 08:54   Dg Chest Port 1 View  11/19/2015  CLINICAL DATA:  Endotracheal tube post trans port. EXAM: PORTABLE CHEST 1 VIEW COMPARISON:  11/19/2015 FINDINGS: Endotracheal tube tip is low, measuring 1.5 cm above carina. Shallow inspiration with atelectasis or infiltration in the right lung base. No pneumothorax. Mild cardiac enlargement. Calcification of the aorta. Nodular opacity seen previously in the right upper lung are less prominent. IMPRESSION: Endotracheal tube tip measures 1.5 cm above the carina. Shallow inspiration with infiltration or atelectasis in the right lung base. Cardiac enlargement. Electronically Signed   By: Lucienne Capers M.D.   On: 11/12/2015 01:30    Assessment and plan:   Spencer Lopez is an 45 y.o. male patient with severe hypoxic encephalopathy secondary to cardiac arrest, with EEG monitoring showing burst suppression pattern, the burst activity consisting of abnormal epileptiform discharges, indicating of nonconvulsive electrographic seizures. He was on Versed infusion until this morning. I recommend discontinuing sedation at this point to evaluate for background cerebral activity on EEG and for any improvement of his clinical status. Overall based on his poor clinical responsiveness, and severely suppressed EEG, his prognosis for meaningful neurological recovery is extremely poor. I discussed this at length with the patient's family, answered several of their questions. He  remains full code.  At 1800 hrs., patient coded, with attempted resuscitation. Eventually the resuscitation efforts were stopped at family's request.

## 2015-11-28 NOTE — Progress Notes (Signed)
Spoke with CCM MD - Vassie LollAlva and Neurologist - Dr. Lavon PaganiniNandigam about pt plan of care for the day. Dr. Lavon PaganiniNandigam instructed me to leave the propofol off and start titrating the Versed down by 5 mg every hour and he would be by to assess the EEG status later today. Started titrating at 0930.  Spencer Lopez GARNER

## 2015-11-28 NOTE — Progress Notes (Signed)
CCM notified of patient's blood pressure and heart rate. Patient maxed out on Dopamine at this time. Decreasing propfol rate.

## 2015-11-28 NOTE — Discharge Summary (Signed)
PULMONARY / CRITICAL CARE MEDICINE   Name: Spencer Lopez MRN: [id number] DOB: 05/13/1971    ADMISSION DATE:  10/28/2015  HISTORY OF PRESENT ILLNESS:  45 year old male with PMH as below, which includes ESRD on HD, DM, HTN. He was [facility] 3/23 AM s/p cardiac arrest at home Patient had reportedly missed dialysis session(s). He was found down without pulse by brother who started CPR at scene and called EMS. ROSC was achieved by EMS within about 10 mins of the patient being found down. In ED he was in complete heart block, likely secondary to potassium >7.5. He was also hypoglycemic (20). He was intubated in ED. Dialysis catheter was placed and he was started on HD after temporizing measures (Ca++, insulin). He was not started on induced hypothermia due to temp 30C on admit.  Hosptial course complicated by coagulapathy (improved with Theod[facility]ra), Status epilepticus  Patient transferred to ICU for PCCM admission.     STUDIES:  CT head 3/23 > no acute abnormality EEG 3/23 > status epilepticus  Echo 3/24 >EF 40%, diffuse hypo cEEG 3/24 > 2 Hz generalized spike and slow wave activity c/w status   MICROBIOLOGY: MRSA PCR 3/23:  Negative  ANTIBIOTICS: Zosyn 3/23>>>  SIGNIFICANT EVENTS: 3/23 - cardiac arrest 3/24 - [facility] lt AVF bleeding >> required VVS suture 3/26 propofol off due to vent arythmias, rising TGs  LINES/TUBES: OETT 7.5 3/23>>> OGT 3/23>>> R fem CVL 3/23 >>> PIV x1  ASSESSMENT / PLAN:  NEUROLOGIC A:   Status Epilepticus Acute Anoxic Encephalopathy  P:   Versed gtt - being titrated per Continuous EEG - Neuro to guide Keppra IV Depacon IV   PULMONARY A: Acute Hypoxic Respiratory Failure  P:  Full vent support  CARDIOVASCULAR A: Cardiac Arrest - Unknown downtime. Likely secondary to hyperkalemia. Shock - Previously resolved. Secondary to sedation. Bradycardia - Secondary to sedation.  P:  Switch from Neo to Dopamine to  maintain MAP & HR  RENAL A:   ESRD on HD Hyperkalemia - Resolved. Hyponatremia - Resolved.  Pseudo-Hypocalcemia  P:    HD per Renal   HEMATOLOGIC A:   Anemia - Hgb stable. Coagulopathy - On warfarin ? reason (initial INR 9, [facility]FP[facility]). Resolved.   INFECTIOUS A:   Probable Aspiration  P:   Zosyn x 7ds total Procalcitonin high  ENDOCRINE A:   H/O DM    P:   Accu-checks q4hr SSI per algorithm   Course :  Dismal neuro prognosis for meaningful recovery given to family. He suffered another cardiac arrest on 3/27 & passed away  Cause of death -  Anoxic encephalopathy, cardiac arrest, hyperkalemia, ESRD  Spencer MilchALVA,[name] MD

## 2015-11-28 NOTE — Code Documentation (Signed)
  Patient Name: Spencer Lopez   MRN: [id number]   Date of Birth/ Sex: 09/10/1970 , male      Admission Date: 11/07/2015  Attending Provider: Roslynn AmbleJennings E Nestor, MD  Primary Diagnosis: Seizures ESRD coagulopathy   Indication: Pt was in his usual state of health until this PM, when he was noted to be in asystole at 1748. Code blue was subsequently called. At the time of arrival on scene, ACLS protocol was underway.   Technical Description:  - CPR performance duration:  12  minutes  - Was defibrillation or cardioversion used? No   - Was external pacer placed? No  - Was patient intubated pre/post CPR? Yes   Medications Administered: Y = Yes; Blank = No Amiodarone    Atropine    Calcium    Epinephrine  Y  Lidocaine    Magnesium    Norepinephrine    Phenylephrine    Sodium bicarbonate    Vasopressin    Other    Post CPR evaluation:  - Final Status - Was patient successfully resuscitated ? No   Miscellaneous Information:  - Time of death:  1800  PM  - Primary team notified?  Yes  - Family Notified? Yes, present at bedside and discontinued efforts at their request     Fuller Planhristopher W Iantha Titsworth, MD   11/03/2015, 7:35 PM

## 2015-11-28 NOTE — Progress Notes (Signed)
1800~Family at bedside. 2030~Post mortem care performed. 2053~CDS notified. Family has patient belongings. 2200~Patient taken to morgue.

## 2015-11-28 NOTE — Progress Notes (Signed)
PULMONARY / CRITICAL CARE MEDICINE   Name: Spencer Lopez MRN: [id number] DOB: 12/23/1970    ADMISSION DATE:  11/19/2015 CONSULTATION DATE:  11/12/2015  [facility]  [facility]  CHIEF COMPLAINT:  Cardiac arrest  HISTORY OF PRESENT ILLNESS:  45 year old male with PMH as below, which includes ESRD on HD, DM, HTN. [facility] AM s/p cardiac arrest at home Patient had reportedly missed dialysis session(s). He was found down without pulse by brother who started CPR at scene and called EMS. ROSC was achieved by EMS within about 10 mins of the patient being found down. In ED he was in complete heart block, likely secondary to potassium >7.5. He was also hypoglycemic (20). He was intubated in ED. Dialysis catheter was placed and he was started on HD after temporizing measures (Ca++, insulin). He was not started on induced hypothermia due to temp 30C on admit.  Hosptial course complicated by coagulapathy (improved with Theodoro Parma[facility]Centra), Status epilepticus  Patient transferred to ICU for PCCM admission.   SUBJECTIVE: Afebrile Remains critically ill on high dose Versed yesterday for seizure suppression with cEEG monitoring  REVIEW OF SYSTEMS: Unable to assess given intubation & sedation.  VITAL SIGNS: BP 100/67 mmHg  Pulse 83  Temp(Src) 98.9 F (37.2 C) (Axillary)  Resp 18  Wt 192 lb 3.9 oz (87.2 kg)  SpO2 98%  HEMODYNAMICS:    VENTILATOR SETTINGS: Vent Mode:  [-] PRVC FiO2 (%):  [30 %-40 %] 30 % Set Rate:  [16 bmp] 16 bmp Vt Set:  [550 mL] 550 mL PEEP:  [5 cmH20] 5 cmH20 Plateau Pressure:  [17 cmH20-20 cmH20] 20 cmH20  INTAKE / OUTPUT: I/O last 3 completed shifts: In: 4170.5 [I.V.:3300.5; IV Piggyback:870] Out: -825 [Urine:125; Emesis/NG output:50]  PHYSICAL EXAMINATION: General:  Sedated.On continuous EEG. Integument:  Warm & dry. No rash on exposed skin.  HEENT:  No scleral injection or icterus. Endotracheal tube in place.  Cardiovascular:  Regular rhythm but  bradycardic. No edema. No appreciable JVD.  Pulmonary:  Clear bilaterally to auscultation. Symmetric chest wall rise on ventilator. Abdomen: Soft. Hypoactive bowel sounds. Nondistended.  Neurological: Currently sedated on propofol & versed. No cough, gag, or corneal reflexes. No withdrawal to pain in extremities.  LABS:  BMET  Recent Labs Lab 11/21/15 0600 11/22/15 0530 2015-10-08 0440  NA 139 139 137  K 4.7 4.4 4.7  CL 99* 100* 96*  CO2 24 24 20*  BUN 78* 43* 48*  CREATININE 10.38* 7.08* 8.74*  GLUCOSE 137* 139* 140*    Electrolytes  Recent Labs Lab 11/22/2015 0120 11/22/2015 1230 11/21/15 0600 11/21/15 0910 11/22/15 0530 2015-10-08 0440  CALCIUM 7.6* 7.3* 7.3*  --  7.4* 7.0*  MG 2.2 2.4  --   --  2.0 1.7  PHOS 7.1* 7.7*  --  7.7*  --   --     CBC  Recent Labs Lab 11/21/15 0600 11/22/15 0530 2015-10-08 0440  WBC 13.7* 10.7* 11.7*  HGB 8.3* 8.5* 8.5*  HCT 24.7* 25.2* 26.1*  PLT 175 182 181    Coag's  Recent Labs Lab 11/02/2015 0121 11/22/15 0530 2015-10-08 0440  APTT 37 45* 53*  INR 1.21 1.74* 2.44*    Sepsis Markers  Recent Labs Lab 11/19/15 1227 10/30/2015 0115 11/26/2015 0219 11/21/15 0600 11/22/15 0530  LATICACIDVEN 1.8 1.8  --   --   --   PROCALCITON  --   --  24.84 26.69 19.54    ABG  Recent Labs Lab 11/19/15  0301 11/22/2015 0217  PHART 7.21* 7.469*  PCO2ART 34 40.1  PO2ART 225* 113*    Liver Enzymes  Recent Labs Lab 11/19/2015 0120 11/22/15 0530 Dec 03, 2015 0440  AST 99* 58* 62*  ALT 121* 56 42  ALKPHOS 148* 103 120  BILITOT 1.1 0.8 0.9  ALBUMIN 2.6* 2.2* 2.2*    Cardiac Enzymes  Recent Labs Lab 11/19/15 0250 11/03/2015 0120 11/27/2015 1230  TROPONINI 0.17* 0.50* 0.39*    Glucose  Recent Labs Lab 11/22/15 1157 11/22/15 1523 11/22/15 2040 11/22/15 2350 12-03-2015 0332 03-Dec-2015 0748  GLUCAP 96 119* 165* 120* 108* 155*    Imaging No results found.   STUDIES:  CT head 3/23 > no acute abnormality EEG 3/23 > status  epilepticus  Echo 3/24 >EF 40%, diffuse hypo cEEG 3/24 > 2 Hz generalized spike and slow wave activity c/w status   MICROBIOLOGY: MRSA PCR 3/23:  Negative  ANTIBIOTICS: Zosyn 3/23>>>  SIGNIFICANT EVENTS: 3/23 - cardiac arrest 3/24 - transfer to [facility] 3/26 lt AVF bleeding >> required VVS suture 3/26 propofol off due to vent arythmias, rising TGs  LINES/TUBES: OETT 7.5 3/23>>> OGT 3/23>>> R fem CVL 3/23 >>> PIV x1  ASSESSMENT / PLAN:  NEUROLOGIC A:   Status Epilepticus Acute Anoxic Encephalopathy  P:   RASS goal: 0 Neurology following Versed gtt - being titrated per Continuous EEG - Neuro to guide Keppra IV Depacon IV Off Propofol gtt   PULMONARY A: Acute Hypoxic Respiratory Failure  P:  Full vent support WUA & SBT once seizures stop  CARDIOVASCULAR A: Cardiac Arrest - Unknown downtime. Likely secondary to hyperkalemia. Shock - Previously resolved. Secondary to sedation. Bradycardia - Secondary to sedation.  P:  Switch from Neo to Dopamine to maintain MAP & HR  RENAL A:   ESRD on HD Hyperkalemia - Resolved. Hyponatremia - Resolved.  Pseudo-Hypocalcemia  P:   Nephrology consulted Trending electrolytes daily  HD per Renal  GASTROINTESTINAL A:   Transaminitis - Likely shock liver. Resolving.  P:   NPO Hold TF for now Trend LFT intermittently Protonix IV daily  HEMATOLOGIC A:   Anemia - Hgb stable. Coagulopathy - On warfarin ? reason (initial INR 9, received FFP, [facility] at [facility]). Resolved.  P:  Transfuse per ICU guideline SCDs  Dc Heparin since INR high Trending Coags daily  INFECTIOUS A:   Probable Aspiration  P:   Zosyn x 7ds total Procalcitonin high  ENDOCRINE A:   H/O DM    P:   Accu-checks q4hr SSI per algorithm  FAMILY  - Updates: Fianc at bedside 3/25 by Dr. Jamison Neighbor & Neurology.  - Inter-disciplinary family meet or Palliative Care meeting due by:  3/30   TODAY'S SUMMARY: 45 year old male with ESRD and DM  suffered cardiac arrest 3/23. Unknown downtime. ROSC after 10 mins CPR. Status epilepticus in setting likely anoxic injury. Dismal neuro prognosis for meaningful recovery  The patient is critically ill with multiple organ systems failure and requires high complexity decision making for assessment and support, frequent evaluation and titration of therapies, application of advanced monitoring technologies and extensive interpretation of multiple databases. Critical Care Time devoted to patient care services described in this note independent of APP time is 35 minutes.    Cyril Mourning MD. Tonny Bollman. [place] [facility] Pager [phone] If no response call [phone]   12/03/2015   12/03/2015 9:30 AM

## 2015-11-28 NOTE — Progress Notes (Signed)
Pt went PEA at 1748, [facility] system [facility], chest compression and ACLS began. Pt mother, father and fiance were at the bedside during the code, chaplain was available in the room for them. Pt mother called the code and asked us to stop chest compression at 1800, pt was asystole at that time. Post mortem checklist has been completed.   Cardiac time of death 11/26/2015 - 1800  Spencer Lopez

## 2015-11-28 NOTE — Progress Notes (Signed)
CCM notified that pressure continues to drop even with decreasing propofol. CCM stated to continue to back off of propofol. Will continue to monitor patient.

## 2015-11-28 NOTE — Progress Notes (Signed)
Chaplain responded to Code Blue for pt in 3M11. I stood by pt'[s fiance and supported her during the code. She said she had traveled from [place] and been here since Saturday. She was very tearful. Also provided brief support to pt's parents. They too were tearful. After medical team left I spoke brief words of comfort and left to give family their privacy to grieve. I asked RN to page me if I was needed further.

## 2015-11-28 NOTE — Progress Notes (Signed)
Spoke with Dr. Roseanne RenoStewart (neurology) to notify him that patient is now off Propofol due to hypotension. No neuro change at this time.

## 2015-11-28 DEATH — deceased

## 2017-12-30 IMAGING — CT CT HEAD W/O CM
3 series · 16 of 30 positions shown, 17 images · non-contrast
Comparison: None

CLINICAL DATA: Post cardiac arrest.  Intubated.  Unresponsive.

EXAM:
CT HEAD WITHOUT CONTRAST
TECHNIQUE: Contiguous axial images were obtained from the base of the skull
through the vertex without intravenous contrast.

[Series 2: soft tissue · axial · 0.42mm/px · z∈[-146,-56]mm · 4 of 31 slices shown, 5 images]
[im 7/31  brain]
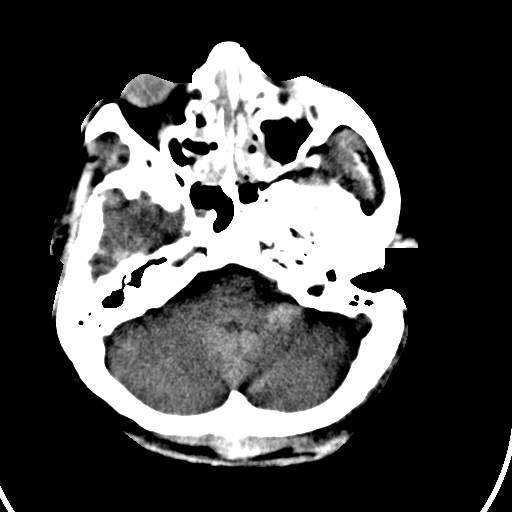
[im 7/31  bone]
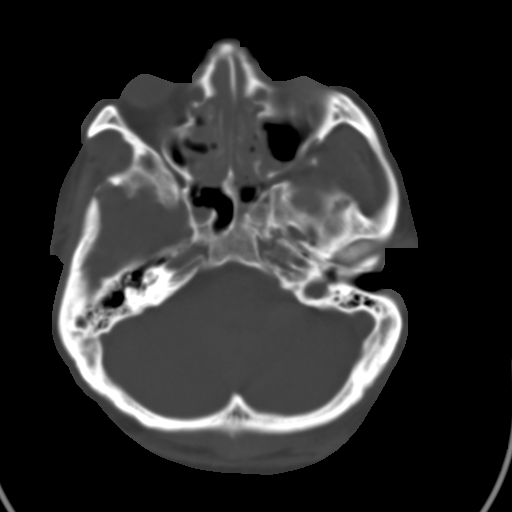
[im 13/31  brain]
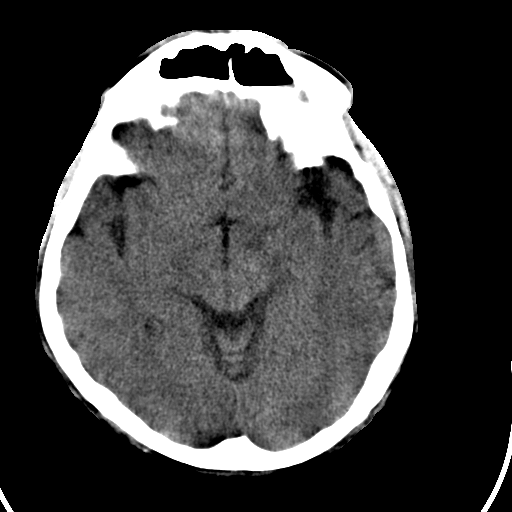
[im 19/31  brain]
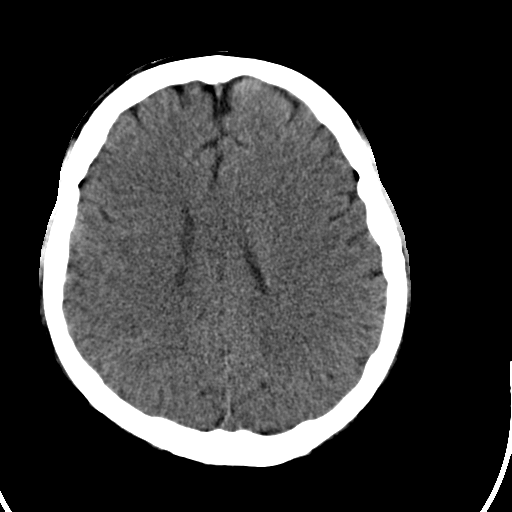
[im 25/31  brain]
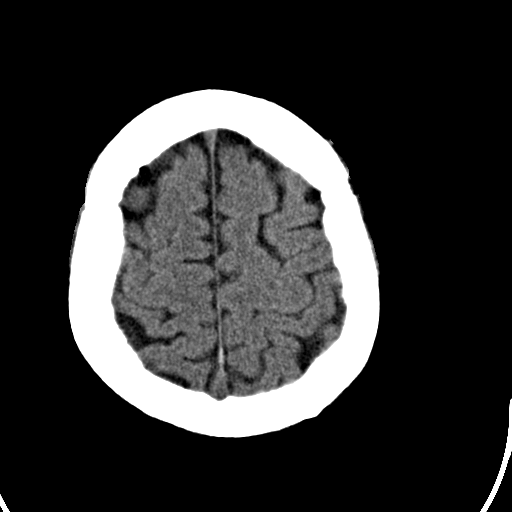

[Series 3: bone · axial · 0.42mm/px · z∈[-166,-26]mm · 8 of 87 slices shown]
[im 11/87  bone]
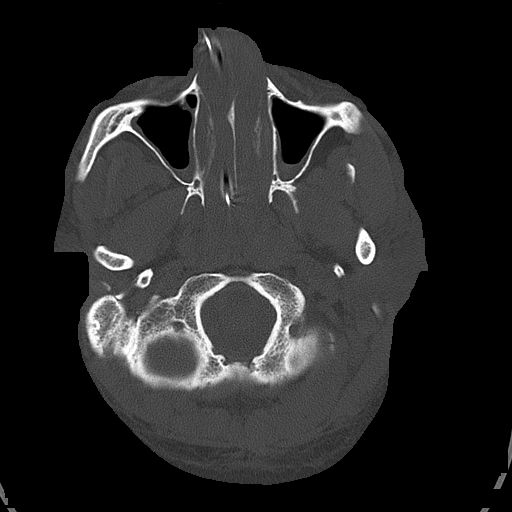
[im 21/87  bone]
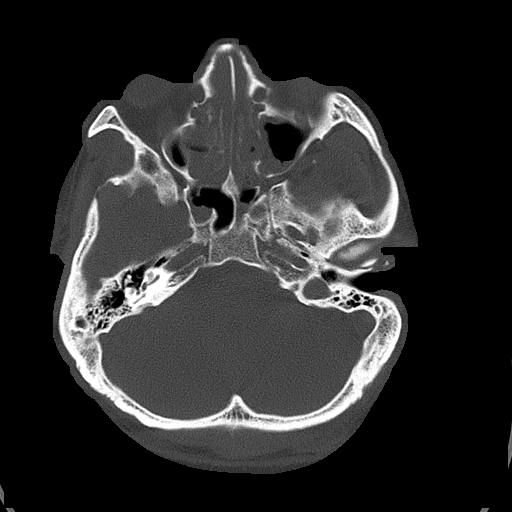
[im 31/87  bone]
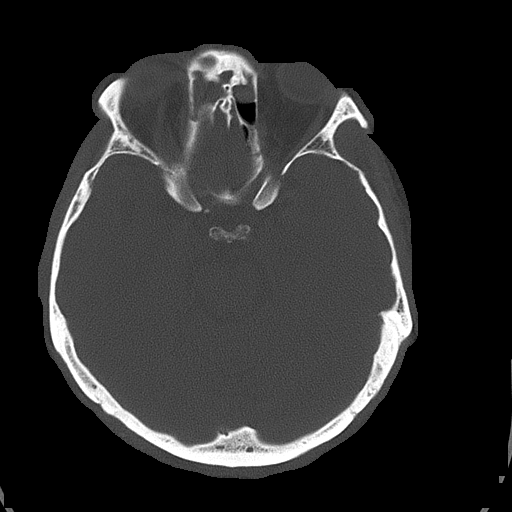
[im 41/87  bone]
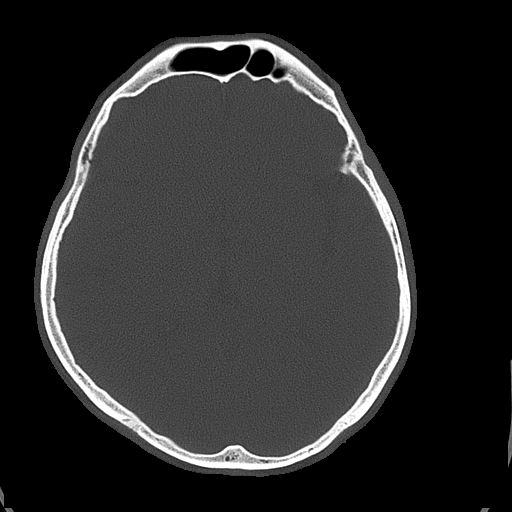
[im 51/87  bone]
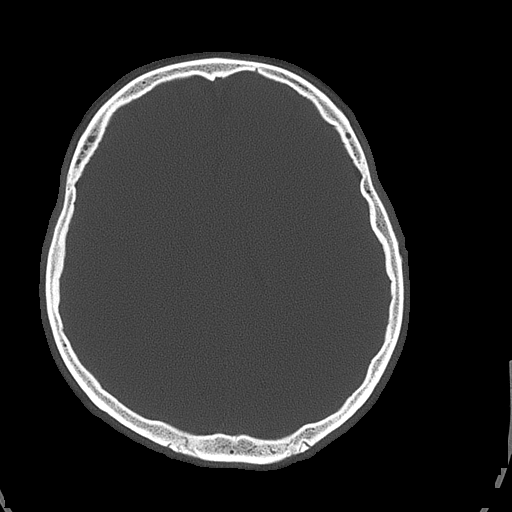
[im 61/87  bone]
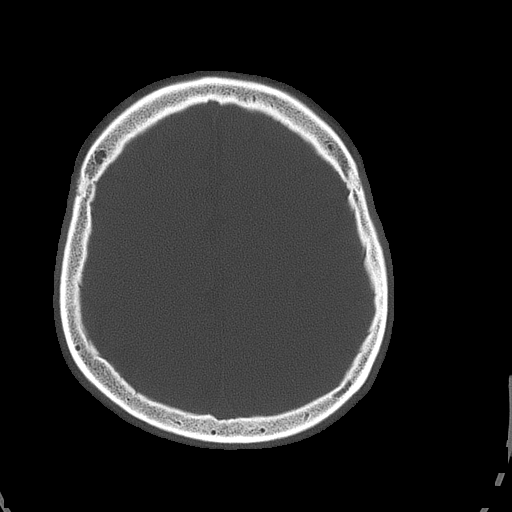
[im 71/87  bone]
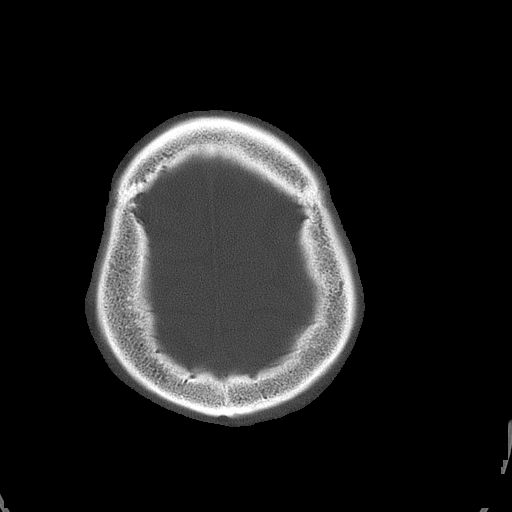
[im 81/87  bone]
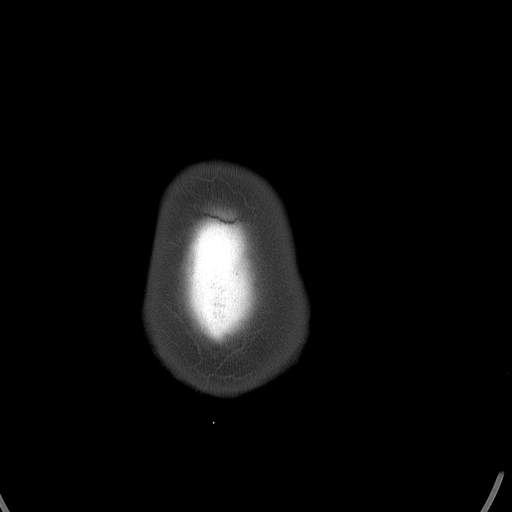

[Series 4: soft tissue recon · axial · 0.42mm/px · z∈[-93,-10]mm · 4 of 30 slices shown]
[im 6/30  brain]
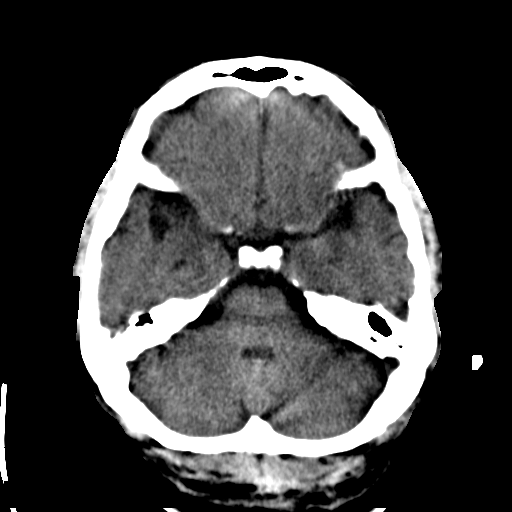
[im 12/30  brain]
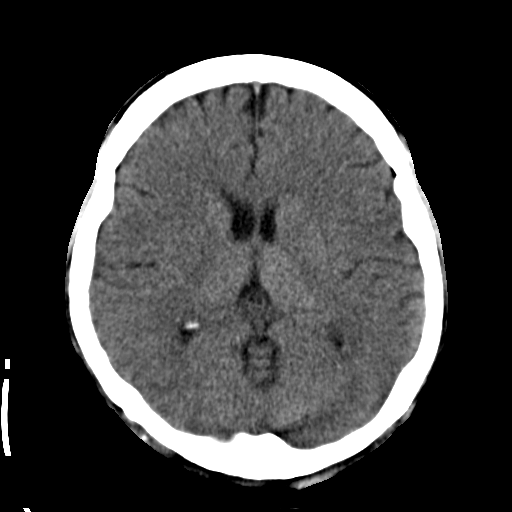
[im 18/30  brain]
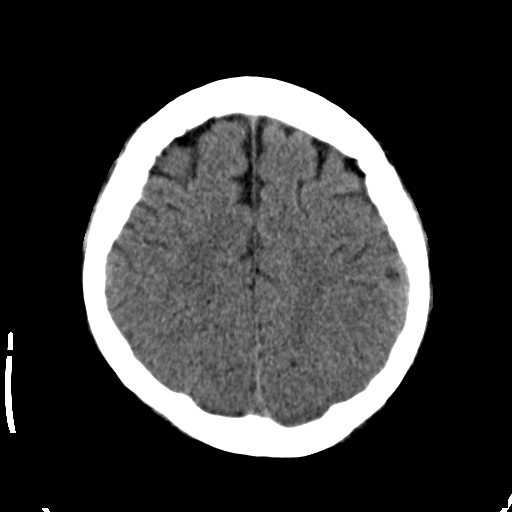
[im 24/30  brain]
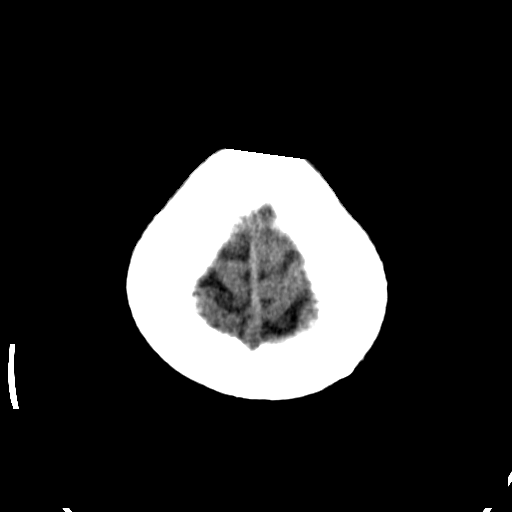

[16 of 30 positions shown; findings below may reference images not displayed]

FINDINGS: Ventricle size is normal. Negative for acute or chronic infarction.
Negative for hemorrhage or fluid collection. Negative for mass or
edema. No shift of the midline structures.

Calvarium is intact.

Mucosal edema in the paranasal sinuses.  Air-fluid levels.  NG tube.
IMPRESSION: Normal CT of the brain

Sinusitis

## 2017-12-31 IMAGING — CR DG CHEST 1V PORT
1 series · 1 of 1 positions shown · non-contrast
Comparison: 11/19/2015

CLINICAL DATA: Endotracheal tube post trans port.

EXAM:
PORTABLE CHEST 1 VIEW

[AP]
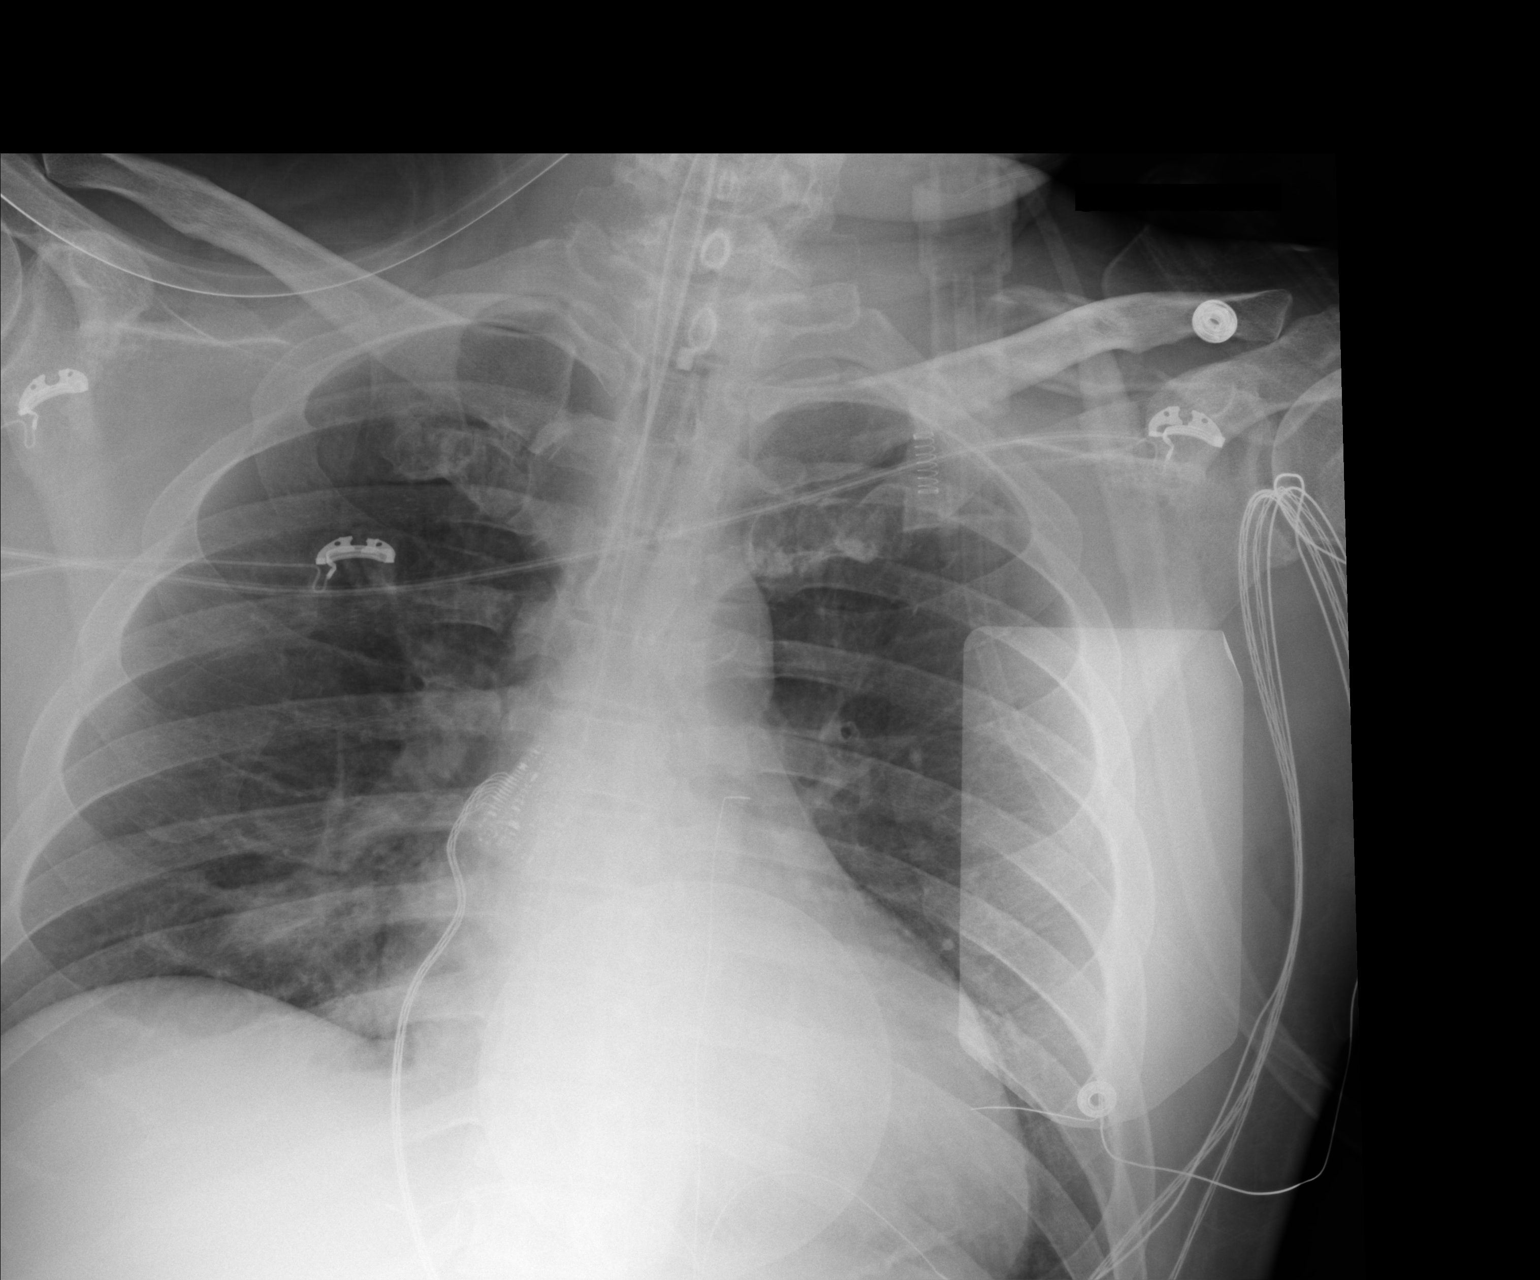

[1 of 1 positions shown; findings below may reference images not displayed]

FINDINGS: Endotracheal tube tip is low, measuring 1.5 cm above carina. Shallow
inspiration with atelectasis or infiltration in the right lung base.
No pneumothorax. Mild cardiac enlargement. Calcification of the
aorta. Nodular opacity seen previously in the right upper lung are
less prominent.
IMPRESSION: Endotracheal tube tip measures 1.5 cm above the carina. Shallow
inspiration with infiltration or atelectasis in the right lung base.
Cardiac enlargement.
